# Patient Record
Sex: Female | Born: 1995 | Race: White | Hispanic: No | Marital: Married | Smoking: Never smoker
Health system: Southern US, Community
[De-identification: ages and names within clinical notes are randomized; demographics above are authoritative.]

## PROBLEM LIST (undated history)

## (undated) DIAGNOSIS — K219 Gastro-esophageal reflux disease without esophagitis: Secondary | ICD-10-CM

## (undated) DIAGNOSIS — I499 Cardiac arrhythmia, unspecified: Secondary | ICD-10-CM

## (undated) DIAGNOSIS — N3010 Interstitial cystitis (chronic) without hematuria: Secondary | ICD-10-CM

## (undated) DIAGNOSIS — I456 Pre-excitation syndrome: Secondary | ICD-10-CM

## (undated) HISTORY — DX: Gastro-esophageal reflux disease without esophagitis: K21.9

## (undated) HISTORY — DX: Cardiac arrhythmia, unspecified: I49.9

## (undated) HISTORY — DX: Interstitial cystitis (chronic) without hematuria: N30.10

---
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
## 2013-04-07 DIAGNOSIS — R1013 Epigastric pain: Secondary | ICD-10-CM | POA: Insufficient documentation

## 2013-09-04 ENCOUNTER — Ambulatory Visit: Payer: BC Managed Care – PPO | Admitting: Cardiovascular Disease

## 2013-09-08 ENCOUNTER — Ambulatory Visit (INDEPENDENT_AMBULATORY_CARE_PROVIDER_SITE_OTHER): Payer: BC Managed Care – PPO | Admitting: Cardiovascular Disease

## 2013-09-08 ENCOUNTER — Encounter: Payer: Self-pay | Admitting: Cardiovascular Disease

## 2013-09-08 VITALS — BP 120/90 | HR 92 | Ht 66.0 in | Wt 158.0 lb

## 2013-09-08 DIAGNOSIS — R002 Palpitations: Secondary | ICD-10-CM

## 2013-09-08 DIAGNOSIS — R000 Tachycardia, unspecified: Secondary | ICD-10-CM

## 2013-09-08 MED ORDER — METOPROLOL SUCCINATE ER 25 MG PO TB24: 25.0000 mg | ORAL_TABLET | Freq: Every day | ORAL | Status: DC

## 2013-09-08 NOTE — Assessment & Plan Note (Signed)
Symptoms are overall mild . Previous outpatient telemetry showed only sinus tachycardia. EKG today shows delta waves which were not present on prior EKGs. 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 Both of them has not manifested with tachyarrhythmia. Continue observation for now. She can continue to use Toprol as needed and should notify us with any episodes of tachycardia especially if associated with presyncope or syncope.

## 2013-09-08 NOTE — Patient Instructions (Signed)
Your physician wants you to follow-up in: 1 year. You will receive a reminder letter in the mail two months in advance. If you don't receive a letter, please call our office to schedule the follow-up appointment.  

## 2013-09-08 NOTE — Progress Notes (Signed)
HPI  Katherine Schultz is a pleasant 18 year old female who is here today for a followup visit regarding sinus tachycardia. She is with her mother today. 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 Her father who is my patient has WPW pattern on his EKG.  She is suspected of having inappropriate sinus tachycardia which has been treated with small dose Toprol. She has not taken this medication since August with no worsening symptoms. She complains of palpitations mostly when she is under stress. No other associated symptoms.  No Known Allergies   No current outpatient prescriptions on file prior to visit.   No current facility-administered medications on file prior to visit.     Past Medical History  Diagnosis Date  . Arrhythmia     Tachycardia     History reviewed. No pertinent past surgical history.   Family History  Problem Relation Age of Onset  . Family history unknown: Yes     History   Social History  . Marital Status: Single    Spouse Name: N/A    Number of Children: N/A  . Years of Education: N/A   Occupational History  . Not on file.   Social History Main Topics  . Smoking status: Never Smoker   . Smokeless tobacco: Not on file  . Alcohol Use: No  . Drug Use: No  . Sexual Activity: Not on file   Other Topics Concern  . Not on file   Social History Narrative  . No narrative on file     ROS Constitutional: Negative for fever, chills, diaphoresis, activity change, appetite change and fatigue.  HENT: Negative for hearing loss, nosebleeds, congestion, sore throat, facial swelling, drooling, trouble swallowing, neck pain, voice change, sinus pressure and tinnitus.  Eyes: Negative for photophobia, pain, discharge and visual disturbance.  Respiratory: Negative for apnea, cough  and wheezing.  Cardiovascular: Negative for leg swelling.  Gastrointestinal: Negative for nausea, vomiting, abdominal pain,  diarrhea, constipation, blood in stool and abdominal distention.  Genitourinary: Negative for dysuria, urgency, frequency, hematuria and decreased urine volume.  Musculoskeletal: Negative for myalgias, back pain, joint swelling, arthralgias and gait problem.  Skin: Negative for color change, pallor, rash and wound.  Neurological: Negative for dizziness, tremors, seizures, syncope, speech difficulty, weakness, light-headedness, numbness and headaches.  Psychiatric/Behavioral: Negative for suicidal ideas, hallucinations, behavioral problems and agitation. The patient is not nervous/anxious.     PHYSICAL EXAM   BP 120/90  Pulse 92  Ht 5\' 6"  (1.676 m)  Wt 158 lb (71.668 kg)  BMI 25.51 kg/m2 Constitutional: She is oriented to person, place, and time. She appears well-developed and well-nourished. No distress.  HENT: No nasal discharge.  Head: Normocephalic and atraumatic.  Eyes: Pupils are equal and round. Right eye exhibits no discharge. Left eye exhibits no discharge.  Neck: Normal range of motion. Neck supple. No JVD present. No thyromegaly present.  Cardiovascular: Normal rate, regular rhythm, normal heart sounds. Exam reveals no gallop and no friction rub. No murmur heard.  Pulmonary/Chest: Effort normal and breath sounds normal. No stridor. No respiratory distress. She has no wheezes. She has no rales. She exhibits no tenderness.  Abdominal: Soft. Bowel sounds are normal. She exhibits no distension. There is no tenderness. There is no rebound and no guarding.  Musculoskeletal: Normal range of motion. She exhibits no edema and no tenderness.  Neurological: She is alert and oriented to person, place, and time. Coordination normal.  Skin: Skin is  warm and dry. No rash noted. She is not diaphoretic. No erythema. No pallor.  Psychiatric: She has a normal mood and affect. Her behavior is normal. Judgment and thought content normal.     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

## 2013-09-09 ENCOUNTER — Telehealth: Payer: Self-pay | Admitting: *Deleted

## 2013-09-09 NOTE — Telephone Encounter (Signed)
Message copied by Fransico SettersBAUCOM, Shulamit Donofrio E on Tue Sep 09, 2013  8:46 AM ------      Message from: Lorine BearsARIDA, MUHAMMAD A      Created: Mon Sep 08, 2013  5:13 PM       I forgot to discuss the EKG with her during visit. I tried calling but there was no answer.       The EKG this time showed a delta wave which is similar to what her dad has. This does not change anything  now but I do want her to know that so that she can notify us if gets any unusual palpitations or tachycardia.  ------

## 2013-09-09 NOTE — Telephone Encounter (Signed)
LVM 5/19

## 2013-09-10 NOTE — Telephone Encounter (Signed)
LVM 5/20

## 2013-09-12 NOTE — Telephone Encounter (Signed)
Informed patient of Dr Sheilah Pigeon message  Patient verbalized understanding

## 2014-01-20 ENCOUNTER — Encounter: Payer: Self-pay | Admitting: Family Medicine

## 2014-01-20 ENCOUNTER — Ambulatory Visit (INDEPENDENT_AMBULATORY_CARE_PROVIDER_SITE_OTHER): Payer: BC Managed Care – PPO | Admitting: Family Medicine

## 2014-01-20 VITALS — BP 108/78 | HR 126 | Ht 65.0 in | Wt 161.0 lb

## 2014-01-20 DIAGNOSIS — R29898 Other symptoms and signs involving the musculoskeletal system: Secondary | ICD-10-CM

## 2014-01-20 DIAGNOSIS — M24559 Contracture, unspecified hip: Secondary | ICD-10-CM | POA: Insufficient documentation

## 2014-01-20 DIAGNOSIS — M24851 Other specific joint derangements of right hip, not elsewhere classified: Secondary | ICD-10-CM

## 2014-01-20 DIAGNOSIS — M24859 Other specific joint derangements of unspecified hip, not elsewhere classified: Secondary | ICD-10-CM | POA: Insufficient documentation

## 2014-01-20 DIAGNOSIS — M9981 Other biomechanical lesions of cervical region: Secondary | ICD-10-CM

## 2014-01-20 DIAGNOSIS — M999 Biomechanical lesion, unspecified: Secondary | ICD-10-CM | POA: Insufficient documentation

## 2014-01-20 DIAGNOSIS — M6240 Contracture of muscle, unspecified site: Secondary | ICD-10-CM

## 2014-01-20 NOTE — Patient Instructions (Signed)
Very nice to meet you Exercises 3 times a week. Alternating handouts Exercises on wall.  Heel and butt touching.  Raise leg 6 inches and hold 2 seconds.  Down slow for count of 4 seconds.  1 set of 30 reps daily on both sides.  Ice 20 minutes after running Consider turmeric 500mg  twice daily to help with baseline inflammation.  Come back again in 3 weeks.

## 2014-01-20 NOTE — Progress Notes (Signed)
Katherine ScaleZach Livy Schultz D.O. [facility] [address] [place], [place] [address] Phone: [phone] Subjective:    CC: Right hip and back pain.   FAO:[id number]:Subjective Katherine ExonKaitlyn Schultz is a 18 y.o. female coming in with complaint of  right hip and back pain. She states that she has had this pain intermittently for multiple years. Patient does not remember any true injury. Patient states it is more of a dull throbbing aching pain it does not stop her from any activity. Denies any radiation of pain or any numbness or weakness of the lower extremities. Patient states that the pain is approximately 4/10 in severity and does not take any medications for it. Patient is not doing any other home remedies. Patient when she was a teenager did see multiple different doctors for her hip with no significant diagnosis. He nighttime awakening, denies any fevers chills or any abnormal weight loss.     Past medical history, social, surgical and family history all reviewed in electronic medical record.   Review of Systems: No headache, visual changes, nausea, vomiting, diarrhea, constipation, dizziness, abdominal pain, skin rash, fevers, chills, night sweats, weight loss, swollen lymph nodes, body aches, joint swelling, muscle aches, chest pain, shortness of breath, mood changes.   Objective Blood pressure 108/78, pulse 126, height 5\' 5"  (1.651 m), weight 161 lb (73.029 kg), SpO2 98.00%.  General: No apparent distress alert and oriented x3 mood and affect normal, dressed appropriately.  HEENT: Pupils equal, extraocular movements intact  Respiratory: Patient's speak in full sentences and does not appear short of breath  Cardiovascular: No lower extremity edema, non tender, no erythema  Skin: Warm dry intact with no signs of infection or rash on extremities or on axial skeleton.  Abdomen: Soft nontender  Neuro: Cranial nerves II through XII are intact, neurovascularly intact in all extremities with 2+ DTRs and 2+  pulses.  Lymph: No lymphadenopathy of posterior or anterior cervical chain or axillae bilaterally.  Gait normal with good balance and coordination.  MSK:  Non tender with full range of motion and good stability and symmetric strength and tone of shoulders, elbows, wrist,  knee and ankles bilaterally.  Hip: Right ROM IR: 25 Deg, ER: 45 Deg, Flexion: 120 Deg, Extension: 100 Deg, Abduction: 45 Deg, Adduction: 45 Deg Strength IR: 5/5, ER: 5/5, Flexion: 5/5, Extension: 5/5, Abduction: 4/5, Adduction: 5/5 Pelvic alignment unremarkable to inspection and palpation. Standing hip rotation and gait without trendelenburg sign / unsteadiness. Greater trochanter without tenderness to palpation. No tenderness over piriformis and greater trochanter. No pain with FABER or FADIR. No SI joint tenderness and normal minimal SI movement. Significant tightness of the hip flexors bilaterally. Contralateral hip unremarkable.  Back Exam:  Inspection: Unremarkable  Motion: Flexion 35 deg, Extension 45 deg, Side Bending to 35 deg bilaterally,  Rotation to 45 deg bilaterally  SLR laying: Negative  XSLR laying: Negative  Palpable tenderness: Mild tenderness over the paraspinal musculature on the right side of the lumbar spine FABER: negative. Sensory change: Gross sensation intact to all lumbar and sacral dermatomes.  Reflexes: 2+ at both patellar tendons, 2+ at achilles tendons, Babinski's downgoing.  Strength at foot  Plantar-flexion: 5/5 Dorsi-flexion: 5/5 Eversion: 5/5 Inversion: 5/5  Leg strength  Quad: 5/5 Hamstring: 5/5 Hip flexor: 5/5  Gait unremarkable.  OMT Physical Exam   Standing flexion right  Seated Flexion right  Cervical  C2 flexed rotated and side bent left C4 flexed rotated and side bent right  Thoracic T5 extended rotated and  side bent left   Lumbar L2 flexed rotated and side bent right  Sacrum Right on right     Impression and Recommendations:     This case required  medical decision making of moderate complexity.

## 2014-01-20 NOTE — Assessment & Plan Note (Signed)
Melena patient does have hip flexor tightness bilaterally that is giving her the snapping hip syndrome that she is also describing. Patient given home exercise program, hip abductor strengthening I think would beneficial, as well as core strengthening exercises. Discuss the patient to try to do the stretches after activity. We discussed over-the-counter medicines it could also be beneficial. Patient is going to try to do these different changes and come back again in 3-4 weeks for further followup.

## 2014-01-20 NOTE — Assessment & Plan Note (Signed)
Discussed previously. Patient given home exercises, hip abductor strengthening exercises as well as hip adductor strengthening exercises. Patient does have a try these as well as other modalities and come back in 3 weeks for further evaluation. Differential also includes possible hip dysplasia asthmas as a kid. We would need to consider further imaging if we are concerned with this. Patient though states that the pain has not stopped him from any activity at this time. Patient come back and see me again

## 2014-01-20 NOTE — Assessment & Plan Note (Signed)
Decision today to treat with OMT was based on Physical Exam  After verbal consent patient was treated with HVLA techniques in , cervical, thoracic, lumbar and sacral areas  Patient tolerated the procedure well with improvement in symptoms  Patient given exercises, stretches and lifestyle modifications  See medications in patient instructions if given  Patient will follow up in 3 weeks

## 2014-03-18 DIAGNOSIS — I456 Pre-excitation syndrome: Secondary | ICD-10-CM | POA: Insufficient documentation

## 2014-07-03 DIAGNOSIS — N23 Unspecified renal colic: Secondary | ICD-10-CM | POA: Insufficient documentation

## 2014-11-26 DIAGNOSIS — R300 Dysuria: Secondary | ICD-10-CM | POA: Insufficient documentation

## 2014-12-07 ENCOUNTER — Telehealth: Payer: Self-pay

## 2014-12-07 NOTE — Telephone Encounter (Signed)
Request from [facility] , sent to [facility] on 12/07/2014 .

## 2015-04-25 HISTORY — PX: CYSTOSCOPY: SUR368

## 2015-05-24 DIAGNOSIS — Z01818 Encounter for other preprocedural examination: Secondary | ICD-10-CM | POA: Insufficient documentation

## 2015-05-24 DIAGNOSIS — N946 Dysmenorrhea, unspecified: Secondary | ICD-10-CM | POA: Insufficient documentation

## 2015-05-24 DIAGNOSIS — N94810 Vulvar vestibulitis: Secondary | ICD-10-CM | POA: Insufficient documentation

## 2015-08-04 DIAGNOSIS — N3010 Interstitial cystitis (chronic) without hematuria: Secondary | ICD-10-CM | POA: Insufficient documentation

## 2015-08-04 DIAGNOSIS — M6289 Other specified disorders of muscle: Secondary | ICD-10-CM | POA: Insufficient documentation

## 2015-08-26 ENCOUNTER — Ambulatory Visit: Payer: BLUE CROSS/BLUE SHIELD | Attending: Obstetrics and Gynecology | Admitting: Physical Therapy

## 2015-08-26 ENCOUNTER — Encounter: Payer: Self-pay | Admitting: Physical Therapy

## 2015-08-26 DIAGNOSIS — R293 Abnormal posture: Secondary | ICD-10-CM | POA: Diagnosis present

## 2015-08-26 DIAGNOSIS — R279 Unspecified lack of coordination: Secondary | ICD-10-CM | POA: Diagnosis present

## 2015-08-26 DIAGNOSIS — M791 Myalgia, unspecified site: Secondary | ICD-10-CM

## 2015-08-26 NOTE — Patient Instructions (Addendum)
   You are now ready to begin training the deep core muscles system: diaphragm, transverse abdominis, pelvic floor . These muscles must work together as a team.           The key to these exercises to train the brain to coordinate the timing of these muscles and to have them turn on for long periods of time to hold you upright against gravity (especially important if you are on your feet all day).These muscles are postural muscles and play a role stabilizing your spine and bodyweight. By doing these repetitions slowly and correctly instead of doing crunches, you will achieve a flatter belly without a lower pooch. You are also placing your spine in a more neutral position and breathing properly which in turn, decreases your risk for problems related to your pelvic floor, abdominal, and low back such as pelvic organ prolapse, hernias, diastasis recti (separation of superficial muscles), disk herniations, spinal fractures. These exercises set a solid foundation for you to later progress to resistance/ strength training with therabands and weights and return to other typical fitness exercises with a stronger deeper core.   Do only level 1 (30 reps x twice a day) . Make sure to position spine in neutral (less low back  arch) fingers on rib and pelvis ASIS to check     Proper sitting: feet under knees, stacking ribcage over pelvis, weightbearing on sitting bones and less low back  Arch  Body scan : mindfulness technique for pain management : 3 cycles, dialing gradually to relaxation, to return to activities

## 2015-08-27 NOTE — Therapy (Addendum)
[facility] MAIN [facility] SERVICES [address] [place], [place], [address] Phone: [phone]   Fax:  [phone]  Physical Therapy Evaluation  Patient Details  Name: Katherine Schultz MRN: [id number] Date of Birth: 11-07-1995 Referring Provider: Everlene Other MD  Encounter Date: 08/26/2015      PT End of Session - 08/27/15 1823    Visit Number 1   Number of Visits 12   Date for PT Re-Evaluation 11/18/15   PT Start Time 1330   PT Stop Time 1440   PT Time Calculation (min) 70 min   Activity Tolerance Patient tolerated treatment well;No increased pain   Behavior During Therapy [facility] for tasks assessed/performed      Past Medical History  Diagnosis Date  . Arrhythmia     Tachycardia    History reviewed. No pertinent past surgical history.  There were no vitals filed for this visit.       Subjective Assessment - 08/27/15 1914    Subjective 1) Burning sensation in vaginal area: Pt reports burning sensation in the vaginal area since 02/2014. This sensation worsened as the bladder fills, menstrual cycle, and certain foods (alcohol, acidic foods) and now it has progressively worsened to be like a "burning, stabbing pain" daily 8-10/10 since her diagnostic tests/surgery 06/25/2015 (hydrodistention and scope) . Pt was Dx with IC and placed on an IUD to decrease menstrual-related pain.Difficulty tolerating gynecological exams due to tightness in her pelvic floor.  Triggers of pain:  Intense physical activities (running, walking, engaging abd muscles, and bending) and late day. Pain include radiation of pain on front of thighs. legs, and tops of her feet which has caused her to fall and it has been difficult to get out of bed.  Pt also reports she has LBP withmenstrual cycle and has worsened since her surgery 2) bowel movement: every other to 1x week. Bristol Stool Type 1-4. Pt takes Miralax. Hx of constipation sicne a child and straining. Hx of anal fissures and  hemorrhoid. 3) difficulty with fully emptying urine 50%-75% of the time. Frequency has also increased over the past year and a half.    Pertinent History Hx of painful menses. [place]. Pt will be traveling to [place] on May 16   Patient Stated Goals finding ways to function and return to exercising and manage pain            [facility] PT Assessment - 08/27/15 1902    Assessment   Medical Diagnosis pelvic pain    Referring Provider Everlene Other MD   Precautions   Precautions None   Restrictions   Weight Bearing Restrictions No   Balance Screen   Has the patient fallen in the past 6 months No   Observation/Other Assessments   Observations excessive lumbar lordosis in sitting/ hooklying   Other Surveys  --  NIH-CPSI Female 81%, ZUNG anxiety scale 46%    Coordination   Gross Motor Movements are Fluid and Coordinated --  limited pelvic floor lengthening, chest breathing   Fine Motor Movements are Fluid and Coordinated --  abdominal straining w/ cue for bowel movement   Running   Comments to assess at next session   Other:   Other/ Comments able to leaern pelvic neutral/ tilt without difficulty (pt reported relief in pelvic area/suprapubic area with neuro-reedu)    Posture/Postural Control   Posture Comments lumbopelvic instability with hip flexion                 Pelvic  Floor Special Questions - 08/27/15 1907    External Palpation tenderness and tensions at anterior pelvic floor mm , suprapubic area (decreased post Tx)                  PT Education - 08/27/15 1911    Education provided Yes   Education Details POC, anatomy/physiology, goals, postural alignment, coordination breathing to relax pelvic floor mm, body scan   Person(s) Educated Patient   Methods Explanation;Demonstration;Tactile cues;Verbal cues;Handout   Comprehension Returned demonstration;Verbalized understanding             PT Long Term Goals - 08/31/15 0930    PT LONG TERM GOAL #1    Title Pt will decrease her score on NIH-CPSI Female 81% to < 50% in order to restore pelvic floor function.     Time 2   Period Weeks   Status New   PT LONG TERM GOAL #2   Title Pt will decrease her score on ZUNG anxiety scale 46% to < 30% in order to demo improved self-management of stress/ anxiety to decrease mm spasms.    Time 2   Period Weeks   Status New   PT LONG TERM GOAL #3   Title Pt will demo proper walking, running, standing mechanics in order to return to ADLs.   Time 2   Period Weeks   Status New                  Plan - 08/27/15 1914    Clinical Impression Statement Pt is a 20 yo female who c/o burning sensation in her perineal area, constipation, and has difficulty with completing urination. Pt 's clinical presentations include paradoxical coordination of pelvic floor mm and deep core mm, poor posture, pelvic floor mm tensions/tenderness, and lack of stress-management skills.  Patient will benefit from skilled therapeutic intervention in order to improve the following deficits and impairments:  Pain, Improper body mechanics, Impaired sensation, Increased muscle spasms, Postural dysfunction, Decreased mobility, Decreased coordination, Decreased activity tolerance, Decreased strength, Decreased range of motion, Decreased endurance, Hypomobility, Decreased safety awareness    Rehab Potential Good   PT Frequency 2x / week   PT Duration 2 weeks   PT Treatment/Interventions ADLs/Self Care Home Management;Aquatic Therapy;Cryotherapy;Electrical Stimulation;Moist Heat;Traction;Functional mobility training;Therapeutic activities;Therapeutic exercise;Balance training;Neuromuscular re-education;Manual techniques;Patient/family education   Consulted and Agree with Plan of Care Patient      Patient will benefit from skilled therapeutic intervention in order to improve the following deficits and impairments:  Pain, Improper body mechanics, Impaired sensation, Increased muscle  spasms, Postural dysfunction, Decreased mobility, Decreased coordination, Decreased activity tolerance, Decreased strength, Decreased range of motion, Decreased endurance, Hypomobility, Decreased safety awareness  Visit Diagnosis: Unspecified lack of coordination  Myalgia  Abnormal posture     Problem List Patient Active Problem List   Diagnosis Date Noted  . Hip flexor tightness 01/20/2014  . 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 Nonallopathic lesion of lumbosacral region 01/20/2014  . Nonallopathic lesion of thoracic region 01/20/2014  . Nonallopathic lesion of cervical region 01/20/2014  . Tachycardia, unspecified 10/22/2012    Mariane MastersYeung,Shin Yiing ,PT, DPT, E-RYT  08/27/2015, 7:17 PM  [facility] [address] [place], [place], [address] Phone: [phone]   Fax:  [phone]  Name: Gertie ExonKaitlyn Willis MRN: [id number] Date of Birth: Apr 16, 1996

## 2015-08-31 ENCOUNTER — Ambulatory Visit: Payer: BLUE CROSS/BLUE SHIELD | Admitting: Physical Therapy

## 2015-08-31 DIAGNOSIS — M791 Myalgia, unspecified site: Secondary | ICD-10-CM

## 2015-08-31 DIAGNOSIS — R293 Abnormal posture: Secondary | ICD-10-CM

## 2015-08-31 DIAGNOSIS — R279 Unspecified lack of coordination: Secondary | ICD-10-CM | POA: Diagnosis not present

## 2015-08-31 NOTE — Addendum Note (Signed)
Addended by: Mariane MastersYEUNG, SHIN-YIING on: 08/31/2015 09:35 AM   Modules accepted: Orders

## 2015-08-31 NOTE — Patient Instructions (Addendum)
Deep core level 2    SELF_CARE ROUTINE:  Body scan practice  Restorative pose option (10 min-15 min):  1)  Legs up the wall 2)  yoga blocks placed under midback vertically, and one under head (block is 2" in height)            3) blocks under  Sacrum     Mid-back stretch: Open book    Walking: breathing, center of mass over forefoot, arm swings Standing, semi tandem stance, or feet parallel.  Noticed where weight is through longitudinal arch of sole

## 2015-08-31 NOTE — Therapy (Signed)
[place] [facility] MAIN [facility] SERVICES [address] [place], [place], [address] Phone: [phone]   Fax:  [phone]  Physical Therapy Treatment  Patient Details  Name: Katherine Schultz MRN: [id number] Date of Birth: [birth year] Referring Provider: Everlene Other MD  Encounter Date: 08/31/2015      PT End of Session - 08/31/15 0924    Visit Number 2   Number of Visits 12   Date for PT Re-Evaluation 11/18/15   PT Start Time 0805   PT Stop Time 0900   PT Time Calculation (min) 55 min   Activity Tolerance Patient tolerated treatment well;No increased pain   Behavior During Therapy [facility] for tasks assessed/performed      Past Medical History  Diagnosis Date  . Arrhythmia     Tachycardia    No past surgical history on file.  There were no vitals filed for this visit.      Subjective Assessment - 08/31/15 0921    Subjective Pt reported 50% improvement with pain as she notices when she is tensing up and will apply breahting technique to minimize mm spasms.  Pt finds that having her feet on a stool helps her with bowel movements and now has BM  2-3 x day which she states that "it is unheard of her her,"     Patient is accompained by: Family member  mother   Pertinent History Hx of painful menses. [place]. Pt will be traveling to [place] on May 16   Patient Stated Goals finding ways to function and return to exercising and manage pain            [facility] PT Assessment - 08/31/15 0919    Coordination   Gross Motor Movements are Fluid and Coordinated --  midback tensions limited lower abdominal expansion    Ambulation/Gait   Gait Comments limited arm swing, posterior COM, heel striking                  Pelvic Floor Special Questions - 08/31/15 0917    External Palpation no mm tension/ tenderness           [facility] Adult PT Treatment/Exercise - 08/31/15 0919    Therapeutic Activites    Therapeutic Activities --  restorative poses,  body scan, importance of self-care/relaxa   Neuro Re-ed    Neuro Re-ed Details  see pt instructions with HEP and gait training   Manual Therapy   Manual therapy comments midback STM releases , also at [employer]                PT Education - 08/31/15 0920    Education provided Yes   Education Details HEP   Person(s) Educated Patient   Methods Explanation;Demonstration;Tactile cues;Verbal cues;Handout   Comprehension Returned demonstration;Verbalized understanding             PT Long Term Goals - 08/31/15 0930    PT LONG TERM GOAL #1   Title Pt will decrease her score on NIH-CPSI Female 81% to < 50% in order to restore pelvic floor function.     Time 2   Period Weeks   Status New   PT LONG TERM GOAL #2   Title Pt will decrease her score on ZUNG anxiety scale 46% to < 30% in order to demo improved self-management of stress/ anxiety to decrease mm spasms.    Time 2   Period Weeks   Status New   PT LONG TERM GOAL #3   Title  Pt will demo proper walking, running, standing mechanics in order to return to ADLs.   Time 2   Period Weeks   Status New               Plan - 08/31/15 [id number]    Clinical Impression Statement Pt is progressing well towards her goals and demonstrated coordination of deep core mm. Pt showed  no pelvic floor mm tensions/tenderness today but required maniual Tx for releasing her midback mm to improve diaphragmatic excursion and armswing during gait. Pt showed improved gait mechanics and reported not feeling any pelvic/back pain.   Plan to assess running gait..    Rehab Potential Good   PT Frequency 2x / week   PT Duration 2 weeks   PT Treatment/Interventions ADLs/Self Care Home Management;Aquatic Therapy;Cryotherapy;Electrical Stimulation;Moist Heat;Traction;Functional mobility training;Therapeutic activities;Therapeutic exercise;Balance training;Neuromuscular re-education;Manual techniques;Patient/family education   Consulted and Agree with  Plan of Care Patient      Patient will benefit from skilled therapeutic intervention in order to improve the following deficits and impairments:  Pain, Improper body mechanics, Impaired sensation, Increased muscle spasms, Postural dysfunction, Decreased mobility, Decreased coordination, Decreased activity tolerance, Decreased strength, Decreased range of motion, Decreased endurance, Hypomobility, Decreased safety awareness  Visit Diagnosis: Unspecified lack of coordination  Myalgia  Abnormal posture     Problem List Patient Active Problem List   Diagnosis Date Noted  . Hip flexor tightness 01/20/2014  . 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 Nonallopathic lesion of lumbosacral region 01/20/2014  . Nonallopathic lesion of thoracic region 01/20/2014  . Nonallopathic lesion of cervical region 01/20/2014  . Tachycardia, unspecified 10/22/2012    Mariane MastersYeung,Shin Yiing ,PT, DPT, E-RYT  08/31/2015, 9:38 AM  [place] [facility] [address] [place], [place], [address] Phone: [phone]   Fax:  [phone]  Name: Katherine Schultz MRN: [id number] Date of Birth: [birth year]

## 2015-09-01 ENCOUNTER — Ambulatory Visit: Payer: BLUE CROSS/BLUE SHIELD | Admitting: Physical Therapy

## 2015-09-01 DIAGNOSIS — R279 Unspecified lack of coordination: Secondary | ICD-10-CM | POA: Diagnosis not present

## 2015-09-01 DIAGNOSIS — R293 Abnormal posture: Secondary | ICD-10-CM

## 2015-09-01 DIAGNOSIS — M791 Myalgia, unspecified site: Secondary | ICD-10-CM

## 2015-09-02 NOTE — Therapy (Signed)
[place] MAIN [facility] SERVICES [address] [place], [place], [address] Phone: [phone]   Fax:  [phone]  Physical Therapy Treatment [facility] Note  Patient Details  Name: Katherine Schultz MRN: [id number] Date of Birth: [birth year] Referring Provider: Illene Bolus MD  Encounter Date: 09/01/2015      PT End of Session - 09/02/15 2027    Visit Number 3   Number of Visits 12   Date for PT Re-Evaluation 11/18/15   PT Start Time 0998   PT Stop Time 1140   PT Time Calculation (min) 35 min   Activity Tolerance Patient tolerated treatment well;No increased pain   Behavior During Therapy [facility] for tasks assessed/performed      Past Medical History  Diagnosis Date  . Arrhythmia     Tachycardia    No past surgical history on file.  There were no vitals filed for this visit.      Subjective Assessment - 09/02/15 2023    Subjective Pt reports she has continued to notice improvement with less pain in walking since yesterday's session. Pt is feeling excited to return to run.    Patient is accompained by: Family member  mother   Pertinent History Hx of painful menses. [place]. Pt will be traveling to [place] on May 16   Patient Stated Goals finding ways to function and return to exercising and manage pain            [facility] PT Assessment - 09/02/15 2024    Running   Comments pronation, genu valgus B. (post-Tx: deviations decreased)    Other:   Other/ Comments pt also noted increased tensions in suprapubic area (post Tx: pt reported no more tensions)                      [facility] Adult PT Treatment/Exercise - 09/02/15 2025    Therapeutic Activites    Therapeutic Activities --  running gait training, supportive strengthening for hip abd,   Other Therapeutic Activities running stretches, pylometrics with deep core engaged  down hill, up hill mechanics                PT Education - 09/02/15 2026    Education provided  Yes   Education Details running mechanics with deep core engaged and supportive exercises    Person(s) Educated Patient   Methods Explanation;Demonstration;Tactile cues;Verbal cues   Comprehension Returned demonstration;Verbalized understanding             PT Long Term Goals - 09/02/15 2022    PT LONG TERM GOAL #1   Title Pt will decrease her score on NIH-CPSI Female 81% to < 50% in order to restore pelvic floor function.  (5/10: 37%)   Time 2   Period Weeks   Status Achieved   PT LONG TERM GOAL #2   Title Pt will decrease her score on ZUNG anxiety scale 46% to < 30% in order to demo improved self-management of stress/ anxiety to decrease mm spasms.  (5/10: 29%)   Time 2   Period Weeks   Status Achieved   PT LONG TERM GOAL #3   Title Pt will demo proper walking, running, standing mechanics in order to return to ADLs.   Time 2   Period Weeks   Status Achieved               Plan - 09/02/15 2027    Clinical Impression Statement Pt reported her pelvic pain related  to IC has improved "A very great deal better" based on the [facility]. Pt has met 100% of her goals across 3 visits. Pt's NIH-CPSI score decreased significantly from 81% to 46% and ZUNG anxiety scale from 46% to 29% which indicate she has learned to manage her stress levels which in turn enabled her to decrease her mm spasms w/ IND.  Pt demo IND with management her IC-related Sx and demo'd improved walking and running mechanics following session. Pt is ready to be d/c at this time. PT will provide pt a pelvic health PT referral in [place] via email in case pt may need to receive continued care upon her arrival there for the next 7 months. Pt and mom expressed joy and relief for the improvements pt achieved. Thank you for your referral.     Rehab Potential Good   PT Frequency 2x / week   PT Duration 2 weeks   PT Treatment/Interventions ADLs/Self Care Home Management;Aquatic Therapy;Cryotherapy;Electrical  Stimulation;Moist Heat;Traction;Functional mobility training;Therapeutic activities;Therapeutic exercise;Balance training;Neuromuscular re-education;Manual techniques;Patient/family education   Consulted and Agree with Plan of Care Patient      Patient will benefit from skilled therapeutic intervention in order to improve the following deficits and impairments:  Pain, Improper body mechanics, Impaired sensation, Increased muscle spasms, Postural dysfunction, Decreased mobility, Decreased coordination, Decreased activity tolerance, Decreased strength, Decreased range of motion, Decreased endurance, Hypomobility, Decreased safety awareness  Visit Diagnosis: Unspecified lack of coordination  Myalgia  Abnormal posture     Problem List Patient Active Problem List   Diagnosis Date Noted  . Hip flexor tightness 01/20/2014  . 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 Nonallopathic lesion of lumbosacral region 01/20/2014  . Nonallopathic lesion of thoracic region 01/20/2014  . Nonallopathic lesion of cervical region 01/20/2014  . Tachycardia, unspecified 10/22/2012    Katherine Schultz ,PT, DPT, E-RYT  09/02/2015, 8:32 PM  [place] MAIN [facility] SERVICES [address] [place], [place], [address] Phone: [phone]   Fax:  [phone]  Name: Katherine Schultz MRN: [id number] Date of Birth: [birth year]

## 2015-09-06 ENCOUNTER — Encounter: Payer: Self-pay | Admitting: Physical Therapy

## 2015-09-14 ENCOUNTER — Encounter: Payer: Self-pay | Admitting: Obstetrics and Gynecology

## 2016-10-23 DIAGNOSIS — H40033 Anatomical narrow angle, bilateral: Secondary | ICD-10-CM | POA: Diagnosis not present

## 2016-11-11 ENCOUNTER — Emergency Department
Admission: EM | Admit: 2016-11-11 | Discharge: 2016-11-12 | Disposition: A | Discharge status: Home / Self Care | Payer: BLUE CROSS/BLUE SHIELD | Attending: Emergency Medicine | Admitting: Emergency Medicine

## 2016-11-11 ENCOUNTER — Encounter: Payer: Self-pay | Admitting: Emergency Medicine

## 2016-11-11 DIAGNOSIS — Z79899 Other long term (current) drug therapy: Secondary | ICD-10-CM | POA: Insufficient documentation

## 2016-11-11 DIAGNOSIS — R4020 Unspecified coma: Secondary | ICD-10-CM | POA: Diagnosis not present

## 2016-11-11 DIAGNOSIS — F10920 Alcohol use, unspecified with intoxication, uncomplicated: Secondary | ICD-10-CM | POA: Insufficient documentation

## 2016-11-11 DIAGNOSIS — F10129 Alcohol abuse with intoxication, unspecified: Secondary | ICD-10-CM | POA: Diagnosis not present

## 2016-11-11 DIAGNOSIS — R1111 Vomiting without nausea: Secondary | ICD-10-CM | POA: Diagnosis not present

## 2016-11-11 LAB — CBC
HCT: 41.2 % (ref 35.0–47.0)
Hemoglobin: 14 g/dL (ref 12.0–16.0)
MCH: 30.8 pg (ref 26.0–34.0)
MCHC: 33.9 g/dL (ref 32.0–36.0)
MCV: 90.9 fL (ref 80.0–100.0)
Platelets: 224 10*3/uL (ref 150–440)
RBC: 4.53 MIL/uL (ref 3.80–5.20)
RDW: 13.4 % (ref 11.5–14.5)
WBC: 8.3 10*3/uL (ref 3.6–11.0)

## 2016-11-11 MED ORDER — SODIUM CHLORIDE 0.9 % IV BOLUS (SEPSIS)
2000.0000 mL | Freq: Once | INTRAVENOUS | Status: AC
  Administered 2016-11-11: 2000 mL via INTRAVENOUS

## 2016-11-11 MED ORDER — ONDANSETRON HCL 4 MG/2ML IJ SOLN
INTRAMUSCULAR | Status: AC
  Administered 2016-11-11: 4 mg via INTRAVENOUS
  Filled 2016-11-11: qty 2

## 2016-11-11 MED ORDER — ONDANSETRON HCL 4 MG/2ML IJ SOLN
4.0000 mg | Freq: Once | INTRAMUSCULAR | Status: AC
  Administered 2016-11-11: 4 mg via INTRAVENOUS

## 2016-11-11 NOTE — ED Provider Notes (Signed)
[facility] Emergency Department Provider Note    First MD Initiated Contact with Patient 11/11/16 2336     (approximate)  I have reviewed the triage vital signs and the nursing notes.  Level V caveat: Altered mental status, positive intoxication HISTORY  Chief Complaint Alcohol Intoxication    HPI Katherine Schultz is a 21 y.o. female presents with altered mental status which apparently the patient states is secondary to EtOH ingestion tonight. Per the patient's parents young lady went to a hospital party tonight and reportedly consumed "liquor". Patient states that the patient is not a routine consumer of alcohol.  Past Medical History:  Diagnosis Date  . Arrhythmia    Tachycardia    Patient Active Problem List   Diagnosis Date Noted  . Hip flexor tightness 01/20/2014  . 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 Nonallopathic lesion of lumbosacral region 01/20/2014  . Nonallopathic lesion of thoracic region 01/20/2014  . Nonallopathic lesion of cervical region 01/20/2014  . Tachycardia, unspecified 10/22/2012    History reviewed. No pertinent surgical history.  Prior to Admission medications   Medication Sig Start Date End Date Taking? Authorizing Provider  imipramine (TOFRANIL) 10 MG tablet Take 10 mg by mouth at bedtime.    [provider]  metoprolol succinate (TOPROL-XL) 25 MG 24 hr tablet Take 1 tablet (25 mg total) by mouth daily. 09/08/13   Iran Ouch, MD  pentosan polysulfate (ELMIRON) 100 MG capsule Take 100 mg by mouth 3 (three) times daily.    [provider]    Allergies No known drug allergies  History reviewed. No pertinent family history.  Social History Social History  Substance Use Topics  . Smoking status: Never Smoker  . Smokeless tobacco: Never Used  . Alcohol use No    Review of Systems Constitutional: No fever/chills Eyes: No visual changes. ENT: No sore throat. Cardiovascular: Denies chest  pain. Respiratory: Denies shortness of breath. Gastrointestinal: No abdominal pain.  No nausea, no vomiting.  No diarrhea.  No constipation. Genitourinary: Negative for dysuria. Musculoskeletal: Negative for neck pain.  Negative for back pain. Integumentary: Negative for rash. Neurological: Negative for headaches, focal weakness or numbness.  ____________________________________________   PHYSICAL EXAM:  VITAL SIGNS: ED Triage Vitals [11/11/16 2340]  Enc Vitals Group     BP 117/90     Pulse Rate (!) 109     Resp 20     Temp 98.1 F (36.7 C)     Temp Source Oral     SpO2 100 %     Weight 73 kg (161 lb)     Height 1.651 m (5\' 5" )     Head Circumference      Peak Flow      Pain Score Asleep     Pain Loc      Pain Edu?      Excl. in GC?     Constitutional: Somnolent but arousable by verbal stimuli. Appears intoxicated Eyes: Conjunctivae are normal. PERRL. EOMI. Head: Atraumatic. Mouth/Throat: Mucous membranes are moist.  Oropharynx non-erythematous. Neck: No stridor.   Cardiovascular: Normal rate, regular rhythm. Good peripheral circulation. Grossly normal heart sounds. Respiratory: Normal respiratory effort.  No retractions. Lungs CTAB. Gastrointestinal: Soft and nontender. No distention.  Musculoskeletal: No lower extremity tenderness nor edema. No gross deformities of extremities. Neurologic:  Normal speech and language. No gross focal neurologic deficits are appreciated.  Skin:  Skin is warm, dry and intact. No rash noted. Psychiatric: Mood and affect are normal.  Speech and behavior are normal.  ____________________________________________   LABS (all labs ordered are listed, but only abnormal results are displayed)  Labs Reviewed  ETHANOL - Abnormal; Notable for the following:       Result Value   Alcohol, Ethyl (B) 120 (*)    All other components within normal limits  COMPREHENSIVE METABOLIC PANEL  CBC     Procedures   ____________________________________________   INITIAL IMPRESSION / ASSESSMENT AND PLAN / ED COURSE  Pertinent labs & imaging results that were available during my care of the patient were reviewed by me and considered in my medical decision making (see chart for details).  Patient now alert and oriented 4 denies any trauma. Friend at bedside stated that she drove the patient home and also denies any trauma stating that the patient never fell.      ____________________________________________  FINAL CLINICAL IMPRESSION(S) / ED DIAGNOSES  Final diagnoses:  Alcoholic intoxication without complication (HCC)     MEDICATIONS GIVEN DURING THIS VISIT:  Medications  ondansetron (ZOFRAN) injection 4 mg (4 mg Intravenous Given 11/11/16 2343)  sodium chloride 0.9 % bolus 2,000 mL (2,000 mLs Intravenous New Bag/Given 11/11/16 2351)     NEW OUTPATIENT MEDICATIONS STARTED DURING THIS VISIT:  New Prescriptions   No medications on file    Modified Medications   No medications on file    Discontinued Medications   No medications on file     Note:  This document was prepared using Dragon voice recognition software and may include unintentional dictation errors.    Darci CurrentBrown,  N, MD 11/12/16 [phone]

## 2016-11-11 NOTE — ED Triage Notes (Signed)
Pt to rm 9 via EMS from home, report ETOH intoxication, vomit x 3, pt responsive to stimulation only, non-verbal, parents at bedside

## 2016-11-12 LAB — COMPREHENSIVE METABOLIC PANEL
ALK PHOS: 44 U/L (ref 38–126)
ALT: 15 U/L (ref 14–54)
AST: 19 U/L (ref 15–41)
Albumin: 4.9 g/dL (ref 3.5–5.0)
Anion gap: 11 (ref 5–15)
BUN: 15 mg/dL (ref 6–20)
CALCIUM: 9.1 mg/dL (ref 8.9–10.3)
CO2: 24 mmol/L (ref 22–32)
Chloride: 109 mmol/L (ref 101–111)
Creatinine, Ser: 0.95 mg/dL (ref 0.44–1.00)
GFR calc Af Amer: >60 mL/min (ref >60)
GFR calc non Af Amer: >60 mL/min (ref >60)
Glucose, Bld: 96 mg/dL (ref 65–99)
Potassium: 3.7 mmol/L (ref 3.5–5.1)
SODIUM: 144 mmol/L (ref 135–145)
TOTAL PROTEIN: 7.6 g/dL (ref 6.5–8.1)
Total Bilirubin: 1 mg/dL (ref 0.3–1.2)

## 2016-11-12 LAB — ETHANOL: Alcohol, Ethyl (B): 120 mg/dL — ABNORMAL HIGH (ref <5)

## 2016-11-12 NOTE — ED Notes (Signed)
Pt was assisted to restroom and changed (by choice) into our blue scrubs due to her wet clothing

## 2016-12-01 ENCOUNTER — Other Ambulatory Visit: Payer: BLUE CROSS/BLUE SHIELD

## 2016-12-22 ENCOUNTER — Other Ambulatory Visit: Payer: Self-pay | Admitting: *Deleted

## 2016-12-22 ENCOUNTER — Other Ambulatory Visit: Payer: Self-pay

## 2016-12-22 DIAGNOSIS — R300 Dysuria: Secondary | ICD-10-CM

## 2016-12-22 DIAGNOSIS — R399 Unspecified symptoms and signs involving the genitourinary system: Secondary | ICD-10-CM

## 2016-12-22 LAB — POCT URINALYSIS DIPSTICK
BILIRUBIN UA: NEGATIVE
GLUCOSE UA: NEGATIVE
KETONES UA: NEGATIVE
Nitrite, UA: NEGATIVE
Protein, UA: NEGATIVE
SPEC GRAV UA: 1.015 (ref 1.010–1.025)
Urobilinogen, UA: 0.2 E.U./dL
pH, UA: 6 (ref 5.0–8.0)

## 2016-12-22 MED ORDER — PHENAZOPYRIDINE HCL 97.2 MG PO TABS: 97.0000 mg | ORAL_TABLET | Freq: Three times a day (TID) | ORAL | 0 refills | Status: DC | PRN

## 2016-12-24 LAB — URINE CULTURE

## 2016-12-26 ENCOUNTER — Other Ambulatory Visit: Payer: Self-pay | Admitting: Obstetrics and Gynecology

## 2016-12-26 ENCOUNTER — Telehealth: Payer: Self-pay | Admitting: *Deleted

## 2016-12-26 MED ORDER — AMOXICILLIN 500 MG PO CAPS: 500.0000 mg | ORAL_CAPSULE | Freq: Three times a day (TID) | ORAL | 2 refills | Status: DC

## 2016-12-26 NOTE — Telephone Encounter (Signed)
-----   Message from Purcell NailsMelody N [place]y, [place] sent at 12/26/2016  8:19 AM EDT ----- Please let her know know results, I sent in rx for Amoxil as it is best for this type of UTI. To increase yogurt/probiotics while taking this to prevent yeast infection.

## 2016-12-26 NOTE — Telephone Encounter (Signed)
Spoke with pt

## 2017-01-18 DIAGNOSIS — Z23 Encounter for immunization: Secondary | ICD-10-CM | POA: Diagnosis not present

## 2017-03-27 ENCOUNTER — Other Ambulatory Visit: Payer: Self-pay

## 2017-03-27 ENCOUNTER — Encounter: Payer: Self-pay | Admitting: *Deleted

## 2017-03-27 ENCOUNTER — Emergency Department
Admission: EM | Admit: 2017-03-27 | Discharge: 2017-03-27 | Disposition: A | Discharge status: Home / Self Care | Payer: BLUE CROSS/BLUE SHIELD | Attending: Student in an Organized Health Care Education/Training Program | Admitting: Student in an Organized Health Care Education/Training Program

## 2017-03-27 ENCOUNTER — Emergency Department: Payer: BLUE CROSS/BLUE SHIELD

## 2017-03-27 DIAGNOSIS — R1011 Right upper quadrant pain: Secondary | ICD-10-CM | POA: Diagnosis not present

## 2017-03-27 DIAGNOSIS — Z79899 Other long term (current) drug therapy: Secondary | ICD-10-CM | POA: Diagnosis not present

## 2017-03-27 DIAGNOSIS — R197 Diarrhea, unspecified: Secondary | ICD-10-CM | POA: Insufficient documentation

## 2017-03-27 DIAGNOSIS — I456 Pre-excitation syndrome: Secondary | ICD-10-CM | POA: Diagnosis not present

## 2017-03-27 DIAGNOSIS — R112 Nausea with vomiting, unspecified: Secondary | ICD-10-CM | POA: Insufficient documentation

## 2017-03-27 HISTORY — DX: Pre-excitation syndrome: I45.6

## 2017-03-27 LAB — CBC
HCT: 41.7 % (ref 35.0–47.0)
Hemoglobin: 14.2 g/dL (ref 12.0–16.0)
MCH: 30.9 pg (ref 26.0–34.0)
MCHC: 34.1 g/dL (ref 32.0–36.0)
MCV: 90.7 fL (ref 80.0–100.0)
PLATELETS: 190 10*3/uL (ref 150–440)
RBC: 4.6 MIL/uL (ref 3.80–5.20)
RDW: 12.9 % (ref 11.5–14.5)
WBC: 7.4 10*3/uL (ref 3.6–11.0)

## 2017-03-27 LAB — COMPREHENSIVE METABOLIC PANEL
ALT: 18 U/L (ref 14–54)
AST: 19 U/L (ref 15–41)
Albumin: 4.6 g/dL (ref 3.5–5.0)
Alkaline Phosphatase: 46 U/L (ref 38–126)
Anion gap: 9 (ref 5–15)
BUN: 17 mg/dL (ref 6–20)
CHLORIDE: 104 mmol/L (ref 101–111)
CO2: 26 mmol/L (ref 22–32)
Calcium: 9.7 mg/dL (ref 8.9–10.3)
Creatinine, Ser: 0.84 mg/dL (ref 0.44–1.00)
GFR calc Af Amer: >60 mL/min (ref >60)
GFR calc non Af Amer: >60 mL/min (ref >60)
Glucose, Bld: 111 mg/dL — ABNORMAL HIGH (ref 65–99)
Potassium: 4 mmol/L (ref 3.5–5.1)
SODIUM: 139 mmol/L (ref 135–145)
Total Bilirubin: 1.8 mg/dL — ABNORMAL HIGH (ref 0.3–1.2)
Total Protein: 7.5 g/dL (ref 6.5–8.1)

## 2017-03-27 LAB — URINALYSIS, COMPLETE (UACMP) WITH MICROSCOPIC
BACTERIA UA: NONE SEEN
Bilirubin Urine: NEGATIVE
Glucose, UA: NEGATIVE mg/dL
Ketones, ur: NEGATIVE mg/dL
Leukocytes, UA: NEGATIVE
Nitrite: NEGATIVE
PH: 6 (ref 5.0–8.0)
PROTEIN: NEGATIVE mg/dL
Specific Gravity, Urine: 1.021 (ref 1.005–1.030)

## 2017-03-27 LAB — LIPASE, BLOOD: LIPASE: 26 U/L (ref 11–51)

## 2017-03-27 LAB — POC URINE PREG, ED: PREG TEST UR: NEGATIVE

## 2017-03-27 MED ORDER — PROMETHAZINE HCL 25 MG/ML IJ SOLN
25.0000 mg | Freq: Four times a day (QID) | INTRAMUSCULAR | Status: DC | PRN
  Administered 2017-03-27: 25 mg via INTRAMUSCULAR
  Filled 2017-03-27: qty 1

## 2017-03-27 MED ORDER — PROMETHAZINE HCL 12.5 MG PO TABS: 12.5000 mg | ORAL_TABLET | Freq: Four times a day (QID) | ORAL | 0 refills | Status: DC | PRN

## 2017-03-27 NOTE — ED Triage Notes (Signed)
Pt reports having been having abd pain, diarrhea, and vomiting x 1 week. Pt reports pain increases after eating. No fevers reported but pt denies having checked temp.

## 2017-03-27 NOTE — ED Notes (Signed)
Pt ambulatory to US

## 2017-03-27 NOTE — ED Provider Notes (Signed)
[facility] Emergency Department Provider Note    First MD Initiated Contact with Patient 03/27/17 1910     (approximate)  I have reviewed the triage vital signs and the nursing notes.   HISTORY  Chief Complaint Emesis and Diarrhea    HPI Katherine Schultz is a 21 y.o. female with a history of WPW presents with several days of persistent nausea and vomiting.  Patient states that symptoms started after she ate Sunday set up eggs at [employer] over the weekend and had associated with nonbloody several episodes of intermittent crampy abdominal pain and diarrhea.  Denies any dysuria or vaginal discharge.  Denies any chance of being pregnant.  States that since then she has had persistent episodes of nausea and anytime she eats something she has right upper quadrant and epigastric pain.  Does have significant family history of gallstones.  Denies any fevers at home.  No pain radiating through to her back.  Describes the pain is moderate to severe but her primary complaint is nausea and decreased appetite.  Past Medical History:  Diagnosis Date  . Arrhythmia    Tachycardia  . Wolff-Parkinson-White (WPW) syndrome    History reviewed. No pertinent family history. History reviewed. No pertinent surgical history. Patient Active Problem List   Diagnosis Date Noted  . Hip flexor tightness 01/20/2014  . 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 Nonallopathic lesion of lumbosacral region 01/20/2014  . Nonallopathic lesion of thoracic region 01/20/2014  . Nonallopathic lesion of cervical region 01/20/2014  . Tachycardia, unspecified 10/22/2012      Prior to Admission medications   Medication Sig Start Date End Date Taking? Authorizing Provider  amoxicillin (AMOXIL) 500 MG capsule Take 1 capsule (500 mg total) by mouth 3 (three) times daily. 12/26/16   Shambley, Melody N, CNM  imipramine (TOFRANIL) 10 MG tablet Take 10 mg by mouth at bedtime.    [provider]    metoprolol succinate (TOPROL-XL) 25 MG 24 hr tablet Take 1 tablet (25 mg total) by mouth daily. 09/08/13   Iran OuchArida, Muhammad A, MD  pentosan polysulfate (ELMIRON) 100 MG capsule Take 100 mg by mouth 3 (three) times daily.    [provider]  phenazopyridine (PYRIDIUM) 97 MG tablet Take 1 tablet (97 mg total) by mouth 3 (three) times daily as needed for pain. 12/22/16   Purcell NailsShambley, Melody N, CNM    Allergies Patient has no known allergies.    Social History Social History   Tobacco Use  . Smoking status: Never Smoker  . Smokeless tobacco: Never Used  Substance Use Topics  . Alcohol use: No  . Drug use: No    Review of Systems Patient denies headaches, rhinorrhea, blurry vision, numbness, shortness of breath, chest pain, edema, cough, abdominal pain, nausea, vomiting, diarrhea, dysuria, fevers, rashes or hallucinations unless otherwise stated above in HPI. ____________________________________________   PHYSICAL EXAM:  VITAL SIGNS: Vitals:   03/27/17 1735  BP: 105/69  Pulse: 95  Resp: 18  Temp: 99 F (37.2 C)  SpO2: 100%    Constitutional: Alert and oriented. Well appearing and in no acute distress. Eyes: Conjunctivae are normal.  Head: Atraumatic. Nose: No congestion/rhinnorhea. Mouth/Throat: Mucous membranes are moist.   Neck: No stridor. Painless ROM.  Cardiovascular: Normal rate, regular rhythm. Grossly normal heart sounds.  Good peripheral circulation. Respiratory: Normal respiratory effort.  No retractions. Lungs CTAB. Gastrointestinal: Soft with mild epigastric ttp. No distention. No abdominal bruits. No CVA tenderness. Genitourinary:  Musculoskeletal: No lower extremity  tenderness nor edema.  No joint effusions. Neurologic:  Normal speech and language. No gross focal neurologic deficits are appreciated. No facial droop Skin:  Skin is warm, dry and intact. No rash noted. Psychiatric: Mood and affect are normal. Speech and behavior are  normal.  ____________________________________________   LABS (all labs ordered are listed, but only abnormal results are displayed)  Results for orders placed or performed during the hospital encounter of 03/27/17 (from the past 24 hour(s))  Lipase, blood     Status: None   Collection Time: 03/27/17  5:38 PM  Result Value Ref Range   Lipase 26 11 - 51 U/L  Comprehensive metabolic panel     Status: Abnormal   Collection Time: 03/27/17  5:38 PM  Result Value Ref Range   Sodium 139 135 - 145 mmol/L   Potassium 4.0 3.5 - 5.1 mmol/L   Chloride 104 101 - 111 mmol/L   CO2 26 22 - 32 mmol/L   Glucose, Bld 111 (H) 65 - 99 mg/dL   BUN 17 6 - 20 mg/dL   Creatinine, Ser 1.61 0.44 - 1.00 mg/dL   Calcium 9.7 8.9 - 09.6 mg/dL   Total Protein 7.5 6.5 - 8.1 g/dL   Albumin 4.6 3.5 - 5.0 g/dL   AST 19 15 - 41 U/L   ALT 18 14 - 54 U/L   Alkaline Phosphatase 46 38 - 126 U/L   Total Bilirubin 1.8 (H) 0.3 - 1.2 mg/dL   GFR calc non Af Amer >60 >60 mL/min   GFR calc Af Amer >60 >60 mL/min   Anion gap 9 5 - 15  CBC     Status: None   Collection Time: 03/27/17  5:38 PM  Result Value Ref Range   WBC 7.4 3.6 - 11.0 K/uL   RBC 4.60 3.80 - 5.20 MIL/uL   Hemoglobin 14.2 12.0 - 16.0 g/dL   HCT 04.5 40.9 - 81.1 %   MCV 90.7 80.0 - 100.0 fL   MCH 30.9 26.0 - 34.0 pg   MCHC 34.1 32.0 - 36.0 g/dL   RDW 91.4 78.2 - 95.6 %   Platelets 190 150 - 440 K/uL  Urinalysis, Complete w Microscopic     Status: Abnormal   Collection Time: 03/27/17  5:38 PM  Result Value Ref Range   Color, Urine YELLOW (A) YELLOW   APPearance HAZY (A) CLEAR   Specific Gravity, Urine 1.021 1.005 - 1.030   pH 6.0 5.0 - 8.0   Glucose, UA NEGATIVE NEGATIVE mg/dL   Hgb urine dipstick SMALL (A) NEGATIVE   Bilirubin Urine NEGATIVE NEGATIVE   Ketones, ur NEGATIVE NEGATIVE mg/dL   Protein, ur NEGATIVE NEGATIVE mg/dL   Nitrite NEGATIVE NEGATIVE   Leukocytes, UA NEGATIVE NEGATIVE   RBC / HPF 0-5 0 - 5 RBC/hpf   WBC, UA 0-5 0 - 5  WBC/hpf   Bacteria, UA NONE SEEN NONE SEEN   Squamous Epithelial / LPF 0-5 (A) NONE SEEN   Mucus PRESENT   POC urine preg, ED     Status: None   Collection Time: 03/27/17  6:34 PM  Result Value Ref Range   Preg Test, Ur Negative Negative   ____________________________________________  ____________________________________________  RADIOLOGY  I personally reviewed all radiographic images ordered to evaluate for the above acute complaints and reviewed radiology reports and findings.  These findings were personally discussed with the patient.  Please see medical record for radiology report.  ____________________________________________   PROCEDURES  Procedure(s) performed:  Procedures  Critical Care performed: no ____________________________________________   INITIAL IMPRESSION / ASSESSMENT AND PLAN / ED COURSE  Pertinent labs & imaging results that were available during my care of the patient were reviewed by me and considered in my medical decision making (see chart for details).  DDX: Enteritis, cholelithiasis, cholecystitis, pancreatitis, gastritis, UTI, pregnancy  Katherine Schultz is a 21 y.o. who presents to the ED with 1 week of abdominal pain nausea vomiting and diarrhea.  Nonbloody diarrhea.  She is afebrile and hemodynamically stable.  Blood work is reassuring with exception of mildly elevated bilirubin.  Given her history or family history of biliary pathology ordered a right upper quadrant ultrasound she did have some epigastric and right upper quadrant tenderness to palpation.  Right upper quadrant ultrasound is reassuring.  Patient given IM antiemetic.  Abdominal exam is not clinically consistent with appendicitis.  No family history or concerning features to suggest inflammatory bowel disease.  Patient is not pregnant.  Patient otherwise stable for a trial of outpatient management.      ____________________________________________   FINAL CLINICAL  IMPRESSION(S) / ED DIAGNOSES  Final diagnoses:  RUQ abdominal pain  Nausea vomiting and diarrhea      NEW MEDICATIONS STARTED DURING THIS VISIT:  This SmartLink is deprecated. Use AVSMEDLIST instead to display the medication list for a patient.   Note:  This document was prepared using Dragon voice recognition software and may include unintentional dictation errors.    Willy Eddyobinson, Bernie Fobes, MD 03/27/17 2023

## 2017-03-27 NOTE — Discharge Instructions (Signed)

## 2017-08-29 ENCOUNTER — Encounter: Payer: BLUE CROSS/BLUE SHIELD | Admitting: Obstetrics and Gynecology

## 2017-08-31 ENCOUNTER — Encounter: Payer: Self-pay | Admitting: Obstetrics and Gynecology

## 2017-08-31 ENCOUNTER — Ambulatory Visit (INDEPENDENT_AMBULATORY_CARE_PROVIDER_SITE_OTHER): Payer: BLUE CROSS/BLUE SHIELD | Admitting: Obstetrics and Gynecology

## 2017-08-31 VITALS — BP 109/70 | HR 83 | Ht 66.0 in | Wt 156.0 lb

## 2017-08-31 DIAGNOSIS — N926 Irregular menstruation, unspecified: Secondary | ICD-10-CM | POA: Diagnosis not present

## 2017-08-31 NOTE — Progress Notes (Signed)
  Subjective:     Patient ID: Katherine Schultz, female   DOB: Jan 30, 1996, 22 y.o.   MRN: [id number]  HPI Reports menses 18 days with menses, concerned as she was cramping and symptoms. Was under a lot stress, broke up with boyfriend, and started running more. Has been sexually active x 1 episode before menses. Negative home UPT. When menses started cramping improved and it lasted for 6 days with normal floe.  Review of Systems Negative except stated in HPI    Objective:   Physical Exam A&O x4 Well groomed female in no distress Blood pressure 109/70, pulse 83, height  (1.676 m), weight 156 lb (70.8 kg), last menstrual period 08/24/2017. UPT- Thyroid not enlarged Pelvic declined     Assessment:     Late menses    Plan:     Counseled on common causes of delayed onset of menses: stress, weight changes, thyroid disorder, and sleep changes.  Is past due for check up-will schedule in next 4-6 weeks.   Melody Shambley,CNM

## 2017-10-29 DIAGNOSIS — M5430 Sciatica, unspecified side: Secondary | ICD-10-CM | POA: Diagnosis not present

## 2017-10-29 DIAGNOSIS — M9905 Segmental and somatic dysfunction of pelvic region: Secondary | ICD-10-CM | POA: Diagnosis not present

## 2017-10-29 DIAGNOSIS — R51 Headache: Secondary | ICD-10-CM | POA: Diagnosis not present

## 2017-10-29 DIAGNOSIS — M9901 Segmental and somatic dysfunction of cervical region: Secondary | ICD-10-CM | POA: Diagnosis not present

## 2017-11-02 DIAGNOSIS — R51 Headache: Secondary | ICD-10-CM | POA: Diagnosis not present

## 2017-11-02 DIAGNOSIS — M9901 Segmental and somatic dysfunction of cervical region: Secondary | ICD-10-CM | POA: Diagnosis not present

## 2017-11-02 DIAGNOSIS — M9905 Segmental and somatic dysfunction of pelvic region: Secondary | ICD-10-CM | POA: Diagnosis not present

## 2017-11-02 DIAGNOSIS — M5430 Sciatica, unspecified side: Secondary | ICD-10-CM | POA: Diagnosis not present

## 2017-11-12 DIAGNOSIS — R51 Headache: Secondary | ICD-10-CM | POA: Diagnosis not present

## 2017-11-12 DIAGNOSIS — M5430 Sciatica, unspecified side: Secondary | ICD-10-CM | POA: Diagnosis not present

## 2017-11-12 DIAGNOSIS — M9905 Segmental and somatic dysfunction of pelvic region: Secondary | ICD-10-CM | POA: Diagnosis not present

## 2017-11-12 DIAGNOSIS — M9901 Segmental and somatic dysfunction of cervical region: Secondary | ICD-10-CM | POA: Diagnosis not present

## 2017-11-19 DIAGNOSIS — M9901 Segmental and somatic dysfunction of cervical region: Secondary | ICD-10-CM | POA: Diagnosis not present

## 2017-11-19 DIAGNOSIS — R51 Headache: Secondary | ICD-10-CM | POA: Diagnosis not present

## 2017-11-19 DIAGNOSIS — M9905 Segmental and somatic dysfunction of pelvic region: Secondary | ICD-10-CM | POA: Diagnosis not present

## 2017-11-19 DIAGNOSIS — M5430 Sciatica, unspecified side: Secondary | ICD-10-CM | POA: Diagnosis not present

## 2017-12-14 ENCOUNTER — Encounter: Payer: Self-pay | Admitting: Obstetrics and Gynecology

## 2017-12-14 ENCOUNTER — Other Ambulatory Visit: Payer: Self-pay | Admitting: Obstetrics and Gynecology

## 2017-12-14 ENCOUNTER — Other Ambulatory Visit (INDEPENDENT_AMBULATORY_CARE_PROVIDER_SITE_OTHER): Payer: BLUE CROSS/BLUE SHIELD

## 2017-12-14 ENCOUNTER — Ambulatory Visit: Payer: BLUE CROSS/BLUE SHIELD | Admitting: Obstetrics and Gynecology

## 2017-12-14 VITALS — BP 112/82 | HR 77 | Ht 66.0 in | Wt 149.5 lb

## 2017-12-14 DIAGNOSIS — M545 Low back pain: Secondary | ICD-10-CM

## 2017-12-14 DIAGNOSIS — N946 Dysmenorrhea, unspecified: Secondary | ICD-10-CM

## 2017-12-14 DIAGNOSIS — N3011 Interstitial cystitis (chronic) with hematuria: Secondary | ICD-10-CM | POA: Diagnosis not present

## 2017-12-14 DIAGNOSIS — R300 Dysuria: Secondary | ICD-10-CM

## 2017-12-14 DIAGNOSIS — R102 Pelvic and perineal pain: Secondary | ICD-10-CM

## 2017-12-14 DIAGNOSIS — F3281 Premenstrual dysphoric disorder: Secondary | ICD-10-CM

## 2017-12-14 DIAGNOSIS — Z8719 Personal history of other diseases of the digestive system: Secondary | ICD-10-CM

## 2017-12-14 LAB — POCT URINALYSIS DIPSTICK
Bilirubin, UA: NEGATIVE
GLUCOSE UA: NEGATIVE
KETONES UA: NEGATIVE
Nitrite, UA: NEGATIVE
Protein, UA: POSITIVE — AB
Spec Grav, UA: 1.01 (ref 1.010–1.025)
Urobilinogen, UA: 0.2 E.U./dL
pH, UA: 6.5 (ref 5.0–8.0)

## 2017-12-14 MED ORDER — LEVONORGEST-ETH ESTRAD 91-DAY 0.1-0.02 & 0.01 MG PO TABS: 1.0000 | ORAL_TABLET | Freq: Every day | ORAL | 4 refills | Status: DC

## 2017-12-14 MED ORDER — NAPROXEN 500 MG PO TABS: 500.0000 mg | ORAL_TABLET | Freq: Two times a day (BID) | ORAL | 2 refills | Status: DC

## 2017-12-14 NOTE — Progress Notes (Signed)
  Subjective:     Patient ID: Myna Hidalgo, female   DOB: 05/18/1995, 22 y.o.   MRN: [id number]  HPI Started menses 3 days ago, and had worse pain before menses for a week before. Taking midol complete ( a lot) but it isn't helping. States monthly menses precipitated by low back pain, achiness that radiates down both thighs, increased mood swings, fatigue, heavy menses and lower pelvic pain, and it flares up her IC. Feels really tired and emotional. It has progressively gotten worse over the last year. Typically can't do normal activities right before and during menses.  Is trying to manage IC with dietary changes and tumeric. Denies seeing any blood in urine in some time.   IC has been under control, but flares up with menses.  Review of Systems  Constitutional: Positive for fatigue.  HENT: Negative.   Eyes: Negative.   Respiratory: Negative.   Cardiovascular: Positive for palpitations.  Gastrointestinal: Positive for abdominal distention and abdominal pain.  Endocrine: Negative.   Genitourinary: Positive for dysuria, flank pain, frequency, hematuria, menstrual problem, pelvic pain, urgency and vaginal pain.  Allergic/Immunologic: Positive for food allergies.  Neurological: Negative.   Hematological: Negative.   Psychiatric/Behavioral: Negative.        Objective:   Physical Exam A&Ox4 Well groomed female in no distress Blood pressure 112/82, pulse 77, height '5\' 6"'$  (1.676 m), weight 149 lb 8 oz (67.8 kg), last menstrual period 12/11/2017. HRR abdomen soft and tender in both lower quadrant. Bilateral flank pain (but appears superficial and musclular) Pelvic ultrasound today reveals: Findings:  The uterus measures 7.1 x 5.2 x 2.9 cm. Echo texture is homogeneous without evidence of focal masses.  The Endometrium measures 2.5 mm.  Right Ovary measures 3.5 x 1.7 x 1.8 cm. It is normal in appearance. Left Ovary measures 2.2 x 1.7 x 1.1 cm. It is normal appearance. Survey of the  adnexa demonstrates no adnexal masses. There is no free fluid in the cul de sac.  Impression: 1. Anteflexed uterus appears of normal size and contour. 2. Endometrium measures 2.5 mm 3. Bilateral ovaries appear WNL.    Assessment:     IC Dysmenorrhea PMDD IBS     Plan:     Counseled at length on todays findings and reassured of nothing life threatening, although it is altering ADLs.  Recommend cessation of Midol and trial of Naproxen, and trial of OCPs for menstrual suppression with continuous cycling. Discussed studies showing it improving IC symptoms too.  Labs obtained to rule out other etiology. Will follow up accordingly.   RTC in 10 weeks for physical and recheck.   Tavarus Poteete,CNM

## 2017-12-14 NOTE — Patient Instructions (Signed)
[email or web address] 

## 2017-12-18 ENCOUNTER — Other Ambulatory Visit: Payer: Self-pay | Admitting: Obstetrics and Gynecology

## 2017-12-18 DIAGNOSIS — E559 Vitamin D deficiency, unspecified: Secondary | ICD-10-CM | POA: Insufficient documentation

## 2017-12-18 LAB — B12 AND FOLATE PANEL
FOLATE: 11.6 ng/mL (ref >3.0)
Vitamin B-12: 400 pg/mL (ref 232–1245)

## 2017-12-18 LAB — COMPREHENSIVE METABOLIC PANEL
A/G RATIO: 2.5 — AB (ref 1.2–2.2)
ALBUMIN: 5 g/dL (ref 3.5–5.5)
ALT: 10 IU/L (ref 0–32)
AST: 14 IU/L (ref 0–40)
Alkaline Phosphatase: 41 IU/L (ref 39–117)
BUN / CREAT RATIO: 13 (ref 9–23)
BUN: 13 mg/dL (ref 6–20)
Bilirubin Total: 1.4 mg/dL — ABNORMAL HIGH (ref 0.0–1.2)
CALCIUM: 9.7 mg/dL (ref 8.7–10.2)
CO2: 22 mmol/L (ref 20–29)
CREATININE: 1.02 mg/dL — AB (ref 0.57–1.00)
Chloride: 102 mmol/L (ref 96–106)
GFR calc Af Amer: 89 mL/min/{1.73_m2} (ref >59)
GFR, EST NON AFRICAN AMERICAN: 77 mL/min/{1.73_m2} (ref >59)
GLOBULIN, TOTAL: 2 g/dL (ref 1.5–4.5)
Glucose: 75 mg/dL (ref 65–99)
POTASSIUM: 4.4 mmol/L (ref 3.5–5.2)
SODIUM: 139 mmol/L (ref 134–144)
TOTAL PROTEIN: 7 g/dL (ref 6.0–8.5)

## 2017-12-18 LAB — FOOD ALLERGY PROFILE
Allergen Corn, IgE: <0.1 kU/L
Codfish IgE: <0.1 kU/L
Egg White IgE: <0.1 kU/L
Milk IgE: <0.1 kU/L
Shrimp IgE: <0.1 kU/L
Walnut IgE: <0.1 kU/L
Wheat IgE: <0.1 kU/L

## 2017-12-18 LAB — CBC
HEMOGLOBIN: 13.7 g/dL (ref 11.1–15.9)
Hematocrit: 42.4 % (ref 34.0–46.6)
MCH: 30.7 pg (ref 26.6–33.0)
MCHC: 32.3 g/dL (ref 31.5–35.7)
MCV: 95 fL (ref 79–97)
PLATELETS: 217 10*3/uL (ref 150–450)
RBC: 4.46 x10E6/uL (ref 3.77–5.28)
RDW: 13.7 % (ref 12.3–15.4)
WBC: 5.1 10*3/uL (ref 3.4–10.8)

## 2017-12-18 LAB — HISTAMINE DETERMINATION, BLOOD: Histamine Determination, Blood: 77 ng/mL (ref 12–127)

## 2017-12-18 LAB — VITAMIN D 25 HYDROXY (VIT D DEFICIENCY, FRACTURES): Vit D, 25-Hydroxy: 21.8 ng/mL — ABNORMAL LOW (ref 30.0–100.0)

## 2017-12-18 LAB — FERRITIN: Ferritin: 26 ng/mL (ref 15–150)

## 2017-12-18 MED ORDER — VITAMIN D (ERGOCALCIFEROL) 1.25 MG (50000 UNIT) PO CAPS: 50000.0000 [IU] | ORAL_CAPSULE | ORAL | 1 refills | Status: DC

## 2018-01-21 ENCOUNTER — Ambulatory Visit: Payer: BLUE CROSS/BLUE SHIELD | Admitting: Urology

## 2018-01-21 ENCOUNTER — Encounter: Payer: Self-pay | Admitting: Urology

## 2018-01-21 VITALS — BP 115/78 | HR 86 | Ht 66.0 in | Wt 156.4 lb

## 2018-01-21 DIAGNOSIS — N3020 Other chronic cystitis without hematuria: Secondary | ICD-10-CM

## 2018-01-21 DIAGNOSIS — N3946 Mixed incontinence: Secondary | ICD-10-CM

## 2018-01-21 NOTE — Progress Notes (Signed)
01/21/2018 3:29 PM   Katherine Schultz 1995/10/10 [id number]  Referring provider: Purcell Nails, CNM [address] [place], [place] [address]  Chief Complaint  Patient presents with  . Cystitis    HPI: Dr Achilles Dunk: Diagnosed with interstitial cystitis and apparently improved after hydrodistention.  Physical therapy had been ordered.  Interstitial cystitis diet given. Given Elmiron.  Had been given imipramine  Today The patient's pain is much more consistent in the last 2 months.  It is suprapubic.  It is worse when she urinates.  It is daily.  She voids every 2 or 3 hours with no nocturia.  She is not sexually active  She does not think she ever took the [employer].  She thought imipramine helped minimally.  She thinks the hydrodistention helped for a while with less pain afterwards.  Physical therapy here in [place] helped a lot.  She has an excellent diet  No neurologic issues.  No hysterectomy  Modifying factors: There are no other modifying factors  Associated signs and symptoms: There are no other associated signs and symptoms Aggravating and relieving factors: There are no other aggravating or relieving factors Severity: Moderate Duration: Persistent   PMH: Past Medical History:  Diagnosis Date  . Arrhythmia    Tachycardia  . GERD (gastroesophageal reflux disease)   . IC (interstitial cystitis)   . Wolff-Parkinson-White (WPW) syndrome     Surgical History: Past Surgical History:  Procedure Laterality Date  . CYSTOSCOPY  2017  . WISDOM TOOTH EXTRACTION  2012    Home Medications:  Allergies as of 01/21/2018   No Known Allergies     Medication List        Accurate as of 01/21/18  3:29 PM. Always use your most recent med list.          Levonorgestrel-Ethinyl Estradiol 0.1-0.02 & 0.01 MG tablet Commonly known as:  AMETHIA,CAMRESE Take 1 tablet by mouth daily.   naproxen 500 MG tablet Commonly known as:  NAPROSYN Take 1 tablet (500 mg  total) by mouth 2 (two) times daily with a meal. As needed for pain   Vitamin D (Ergocalciferol) 50000 units Caps capsule Commonly known as:  DRISDOL Take 1 capsule (50,000 Units total) by mouth every 7 (seven) days.       Allergies: No Known Allergies  Family History: No family history on file.  Social History:  reports that she has never smoked. She has never used smokeless tobacco. She reports that she does not drink alcohol or use drugs.  ROS: UROLOGY Frequent Urination?: No Hard to postpone urination?: No Burning/pain with urination?: Yes Get up at night to urinate?: No Leakage of urine?: No Urine stream starts and stops?: No Trouble starting stream?: No Do you have to strain to urinate?: No Blood in urine?: No Urinary tract infection?: No Sexually transmitted disease?: No Injury to kidneys or bladder?: No Painful intercourse?: No Weak stream?: No Currently pregnant?: No Vaginal bleeding?: No Last menstrual period?: 12/11/2017  Gastrointestinal Nausea?: No Vomiting?: No Indigestion/heartburn?: Yes Diarrhea?: No Constipation?: No  Constitutional Fever: No Night sweats?: No Weight loss?: No Fatigue?: No  Skin Skin rash/lesions?: No Itching?: No  Eyes Blurred vision?: No Double vision?: No  Ears/Nose/Throat Sore throat?: No Sinus problems?: No  Hematologic/Lymphatic Swollen glands?: No Easy bruising?: No  Cardiovascular Leg swelling?: No Chest pain?: No  Respiratory Cough?: No Shortness of breath?: No  Endocrine Excessive thirst?: No  Musculoskeletal Back pain?: Yes Joint pain?: No  Neurological Headaches?: No  Dizziness?: No  Psychologic Depression?: No Anxiety?: No  Physical Exam: BP 115/78 (BP Location: Left Arm, Patient Position: Sitting, Cuff Size: Normal)   Pulse 86   Ht 5\' 6"  (1.676 m)   Wt 156 lb 6.4 oz (70.9 kg)   BMI 25.24 kg/m   Constitutional:  Alert and oriented, No acute distress. HEENT: [place] AT, moist mucus  membranes.  Trachea midline, no masses. Cardiovascular: No clubbing, cyanosis, or edema. Respiratory: Normal respiratory effort, no increased work of breathing. GI: Abdomen is soft, nontender, nondistended, no abdominal masses GU: No prolapse or diverticulum.  No bladder or levator tenderness.  Moderate normal vaginal discharge for patient Skin: No rashes, bruises or suspicious lesions. Lymph: No cervical or inguinal adenopathy. Neurologic: Grossly intact, no focal deficits, moving all 4 extremities. Psychiatric: Normal mood and affect.  Laboratory Data: Lab Results  Component Value Date   WBC 5.1 12/14/2017   HGB 13.7 12/14/2017   HCT 42.4 12/14/2017   MCV 95 12/14/2017   PLT 217 12/14/2017    Lab Results  Component Value Date   CREATININE 1.02 (H) 12/14/2017    No results found for: PSA  No results found for: TESTOSTERONE  No results found for: HGBA1C  Urinalysis    Component Value Date/Time   COLORURINE YELLOW (A) 03/27/2017 1738   APPEARANCEUR HAZY (A) 03/27/2017 1738   LABSPEC 1.021 03/27/2017 1738   PHURINE 6.0 03/27/2017 1738   GLUCOSEU NEGATIVE 03/27/2017 1738   HGBUR SMALL (A) 03/27/2017 1738   BILIRUBINUR neg 12/14/2017 0916   KETONESUR NEGATIVE 03/27/2017 1738   PROTEINUR Positive (A) 12/14/2017 0916   PROTEINUR NEGATIVE 03/27/2017 1738   UROBILINOGEN 0.2 12/14/2017 0916   NITRITE neg 12/14/2017 0916   NITRITE NEGATIVE 03/27/2017 1738   LEUKOCYTESUR Small (1+) (A) 12/14/2017 0916    Pertinent Imaging:   Assessment & Plan: Patient has interstitial cystitis.  Multimodality therapy discussed.  Role of repeating hydrodistention discussed.  Patient shows physical therapy consultation and at this stage no hydrodistention or other medication.  She will work harder on her diet and I will see her as needed  There are no diagnoses linked to this encounter.  No follow-ups on file.  Martina Sinner, MD  [facility] [address], [address] [place], [place] [address] [phone]

## 2018-01-22 NOTE — Addendum Note (Signed)
Addended by: Frankey Shown on: 01/22/2018 10:40 AM   Modules accepted: Orders

## 2018-03-01 ENCOUNTER — Ambulatory Visit: Payer: BLUE CROSS/BLUE SHIELD | Admitting: Physical Therapy

## 2018-03-08 ENCOUNTER — Encounter: Payer: BLUE CROSS/BLUE SHIELD | Admitting: Physical Therapy

## 2018-03-08 ENCOUNTER — Ambulatory Visit: Payer: BLUE CROSS/BLUE SHIELD | Attending: Urology | Admitting: Physical Therapy

## 2018-03-08 DIAGNOSIS — M62838 Other muscle spasm: Secondary | ICD-10-CM

## 2018-03-08 DIAGNOSIS — M533 Sacrococcygeal disorders, not elsewhere classified: Secondary | ICD-10-CM | POA: Diagnosis not present

## 2018-03-08 DIAGNOSIS — R293 Abnormal posture: Secondary | ICD-10-CM | POA: Diagnosis not present

## 2018-03-08 NOTE — Patient Instructions (Addendum)
Posture:  Points of contact: standing: more across the ballmounds, soft knees,                               Sitting, feet under knees, sitting  Bones on chair                               Sitting in car seat: heel on floor, shoulders back of head, back of the both hips    Rest stops with stretches every 1.5 hours  ____  Open book ( handout)   Deep core level 1 ( breathing with pelvic floor relaxation)     ____   Avoid straining pelvic floor, abdominal muscles , spine  Use log rolling technique instead of getting out of bed with your neck or the sit-up   Log rolling into and out of .bed  With sidelying position first

## 2018-03-08 NOTE — Therapy (Signed)
[place] [facility] MAIN [facility] SERVICES [address] [place], [place], [address] Phone: [phone]   Fax:  [phone]  Physical Therapy Evaluation  Patient Details  Name: Katherine Schultz MRN: [id number] Date of Birth: Jan 20, 1996 Referring Provider (PT): MacDiarmid    Encounter Date: 03/08/2018  PT End of Session - 03/08/18 0815    Visit Number  1    Number of Visits  12    Date for PT Re-Evaluation  05/31/18    PT Start Time  0810    PT Stop Time  0910    PT Time Calculation (min)  60 min    Activity Tolerance  Patient tolerated treatment well    Behavior During Therapy  [facility] for tasks assessed/performed       Past Medical History:  Diagnosis Date  . Arrhythmia    Tachycardia  . GERD (gastroesophageal reflux disease)   . IC (interstitial cystitis)   . Wolff-Parkinson-White (WPW) syndrome     Past Surgical History:  Procedure Laterality Date  . CYSTOSCOPY  2017  . WISDOM TOOTH EXTRACTION  2012    There were no vitals filed for this visit.   Subjective Assessment - 03/08/18 0816    Subjective 1)  Pelvic pain started back in Jan 2019 when she started running to train for a half marathon. Seven months later, it became worst gradually.  During running, the pain was uncomfortable and she was able to push through it at 2-4/10.  In July, pt's pain worsened to 9-10/10 to the point where she had to collapse to the ground and not be able to walk. Someone would have to pick her up. Pt would have to have a heating pad until the pain subside to 3-4/10.  Pt felt she had run a marathon but had only ran a mile. The pain feels like "stabbing" "burning" and as it starts to subside "dull achey pain".  Today' pain1-2/10.  Morning pain 4/10. Pt no longer is running. Pt had a stretch routine prior ( 5-6 min) and after running 10 min).  No crunches nor sit-ups in routine because she tried and it did not feel right. The pelvic pain used to occur with menstrual cycle  prior/during, and after. Period lasted 5-6 days with heavy bleeding. This pain felt more like bladder pain. Hx of IC. Pt was plaed on a birth control pill 3 months ago to address period pain but it has increased her pelvic pain. Pt is unable to differentiate between period pain versus bladder pain. 5-6/10 "Light version of the stabbing and burning with intense cramping with general achiness in her low back" .   2)  Frequent urgency with urination has increased recently which occurs in the middle of the night. Pt feels the urge but do not get up because she falls back asleep which makes her to feel a alot of pelvic pain upon waking due to a full bladder. Pt has no incontinence with urge nor stress.  Pt feels fatigued upon waking due to feeling. "Having a constant level of pain all the time has exhausted my body out."  Pt had a tendency as a kid to not use the bathroom because she did not like using public bathrooms. Pt no longer has this issue and can relax in public bathrooms.  If pt does not go right away when she feels the urge, pt will difficulty starting the flow with need to strain. Bowel movements are normal. Pt is paying attention to her  food for IC diet and avoid trigger foods which has helped with burning sensation.    3)  Dull achey low back pain 4/10 which has occurred since middle school. It occurs with sitting at her desk, standing for long periods of time 10-15 min.        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  Pt incorporates stretches prior to her classes not after.   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           [facility] PT Assessment - 03/08/18 0854      Assessment   Medical Diagnosis  Chronic Cystitis     Referring Provider (PT)  MacDiarmid       Precautions   Precautions  None      Restrictions   Weight Bearing Restrictions  No      Balance Screen   Has the patient fallen in the past 6 months  No       Observation/Other Assessments   Observations  hyperextended knees       Coordination   Gross Motor Movements are Fluid and Coordinated  --   chest breathing ( supine)   Fine Motor Movements are Fluid and Coordinated  --   overuse adductor mm w/ cue for pelvic floor mm      Posture/Postural Control   Posture Comments  dyscoordination of pelvic floor mm       Strength   Overall Strength Comments  hip flexion 4-/5 with trunk compenstion B, gross BLE 5/5. hip abd/ glut 5/5, posterior sling n: no perurbation         Palpation   Spinal mobility  hypomobility in throacic T/L junction     SI assessment   FADDIR B with hypomobility    Palpation comment  L PSIS more posterior .  Tightness at R pelvic floor external palpation ( post Tx: decreased)       Ambulation/Gait   Gait Comments  hip IR in bilateral stance                 Objective measurements completed on examination: See above findings.      [facility] Adult PT Treatment/Exercise - 03/08/18 0854      Neuro Re-ed    Neuro Re-ed Details   see pt instructions       Manual Therapy   Manual therapy comments  FADDIR AP mob B                    PT Long Term Goals - 03/08/18 1622      PT LONG TERM GOAL #1   Title  Pt will decrease her ZUNG anixety score form % to <% in order to demo improved IND with relaxation practices to minimize Sx    Time  12    Period  Weeks    Status  New    Target Date  05/31/18      PT LONG TERM GOAL #2   Title  Pt will decrease PSQI score from 71% to < 35% inorder to improve sleep quality and wellness    Time  10    Period  Weeks    Status  New    Target Date  05/31/18      PT LONG TERM GOAL #3   Title  Pt will decrease NIH CPSI 49 % to < 25% in order to improve  urination and pain     Time  12    Period  Weeks    Status  New    Target Date  05/31/18      PT LONG TERM GOAL #4   Title  Pt will demo decrease hypomobility of thoracic segments, increase SIJ mobility,  decrease R pelvic floor mm tightness in order to progress towards deep core level 2     Time  2    Period  Weeks    Status  New    Target Date  03/22/18      PT LONG TERM GOAL #5   Title  Pt will demo proper alignment/ modifications to standing/sitting posture, fitness/ dance moves, and running mechanics without cues to demo IND with co-activation of deep core and lower  kinetich chain to minimzie overuse of pelvic floor tightness     Time  8    Period  Weeks    Status  New    Target Date  05/17/18      Additional Long Term Goals   Additional Long Term Goals  Yes      PT LONG TERM GOAL #6   Title  Pt will report no low back pain across 1 week in order to perform work tasks     Time  4    Period  Weeks    Status  New    Target Date  04/05/18      PT LONG TERM GOAL #7   Title  Pt will show proper pelvic floor lengthening and contraction without adductor overuse in order to eliminate urine without straining and to return to running     Time  2    Period  Weeks    Status  New    Target Date  03/22/18             Plan - 03/08/18 1620    Clinical Impression Statement  Pt is a 22 yo with report of chronic pelvic pain, CLBP, and nighttime frequency of urinary urgency. These Sx impact her QOL and ADLs, and sleep quality. Pt's clinical presentations include dyscoordination of pelvic floor, SIJ and thoracic hypomobility, R pelvic floor mm tensions, poor posture and mechanics which strain pelvic floor mm, and lacks flexibility routine. Following Tx today, pt has decreased R pelvic floor mm tensions, increased SIJ mobility, and improve pelvic floor lengthening along with improve posture in sitting, standing, and t/f from supine to sit.    Clinical Presentation  Evolving    Clinical Decision Making  Moderate    Rehab Potential  Good    PT Frequency  1x / week    PT Duration  12 weeks    PT Treatment/Interventions  Therapeutic activities;Therapeutic exercise;Balance training;Aquatic  Therapy;Electrical Stimulation;Moist Heat;Neuromuscular re-education;Scar mobilization;Manual lymph drainage;Patient/family education;Stair training;Taping;Manual techniques;Functional mobility training    Consulted and Agree with Plan of Care  Patient       Patient will benefit from skilled therapeutic intervention in order to improve the following deficits and impairments:  Impaired sensation, Improper body mechanics, Pain, Increased muscle spasms, Decreased mobility, Decreased coordination, Impaired UE functional use, Hypomobility, Decreased strength, Decreased range of motion, Decreased endurance, Decreased activity tolerance, Hypermobility, Postural dysfunction  Visit Diagnosis: Sacrococcygeal disorders, not elsewhere classified  Other muscle spasm  Abnormal posture     Problem List Patient Active Problem List   Diagnosis Date Noted  . Vitamin D deficiency 12/18/2017  . Interstitial cystitis 08/04/2015  . Pelvic floor dysfunction 08/04/2015  .  Dysmenorrhea 05/24/2015  . Preop testing 05/24/2015  . Vestibulitis, vulvar 05/24/2015  . Dysuria 11/26/2014  . Renal colic 07/03/2014  . WPW (Wolff-Parkinson-White syndrome) 03/18/2014  . Hip flexor tightness 01/20/2014  . 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 Nonallopathic lesion of lumbosacral region 01/20/2014  . Nonallopathic lesion of thoracic region 01/20/2014  . Nonallopathic lesion of cervical region 01/20/2014  . Dyspepsia 04/07/2013  . Tachycardia, unspecified 10/22/2012  . Constipation 10/10/2012  . History of anal fissures 10/10/2012  . Lactose intolerance 03/23/2011  . Vasovagal syncope 03/23/2011    Mariane Masters 03/08/2018, 4:34 PM  [facility] MAIN [facility] SERVICES [address] [place], [place], [address] Phone: [phone]   Fax:  [phone]  Name: Katherine Schultz MRN: [id number] Date of Birth: 1995-10-02

## 2018-03-12 ENCOUNTER — Ambulatory Visit: Payer: BLUE CROSS/BLUE SHIELD | Admitting: Physical Therapy

## 2018-03-12 DIAGNOSIS — M533 Sacrococcygeal disorders, not elsewhere classified: Secondary | ICD-10-CM | POA: Diagnosis not present

## 2018-03-12 DIAGNOSIS — M62838 Other muscle spasm: Secondary | ICD-10-CM

## 2018-03-12 DIAGNOSIS — R293 Abnormal posture: Secondary | ICD-10-CM

## 2018-03-12 NOTE — Patient Instructions (Addendum)
   Create a relaxation time in afternoon everyday ( use a calm sounding alarm on the phone for 15 min  _ use ocean sounds which you associate relaxation with  _ notice your body sinking to the bed, arms out, letting go   PROP your knees/legs up on pillows to elevator   _beginning long paced breathing which will help with relaxing your body   _ allow yourself to relax unto the surface beneath you   When the alarm go off, don't immediate get up to turn it off,  Move gradually, log roll to the side, and then sit up.

## 2018-03-12 NOTE — Therapy (Signed)
[facility] MAIN [facility] SERVICES [address] [place], [place], [address] Phone: [phone]   Fax:  [phone]  Physical Therapy Treatment  Patient Details  Name: Katherine Schultz MRN: [id number] Date of Birth: [birth year] Referring Provider (PT): MacDiarmid    Encounter Date: 03/12/2018  PT End of Session - 03/12/18 1501    Visit Number  2    Number of Visits  12    Date for PT Re-Evaluation  05/31/18    PT Start Time  1403    PT Stop Time  1501    PT Time Calculation (min)  58 min    Activity Tolerance  Patient tolerated treatment well    Behavior During Therapy  [facility] for tasks assessed/performed       Past Medical History:  Diagnosis Date  . Arrhythmia    Tachycardia  . GERD (gastroesophageal reflux disease)   . IC (interstitial cystitis)   . Wolff-Parkinson-White (WPW) syndrome     Past Surgical History:  Procedure Laterality Date  . CYSTOSCOPY  2017  . WISDOM TOOTH EXTRACTION  2012    There were no vitals filed for this visit.  Subjective Assessment - 03/12/18 1410    Subjective  Pt reported she felt less pain in her low back / groin area than she typically would feel.  Pt is noticing muscles she has not used before when unlocking her knees with standing. It has helped her knee pain as well.  Pt is feeling period pain 6-7/10 today with some bloody discharge which has been on and off randomly. Pt will communicate with her OB/GYN today.       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  Pt incorporates stretches prior to her classes not after.   Works as a Production designer, theatre/television/film with some standing and sitting.           [facility] PT Assessment - 03/12/18 1448      Coordination   Gross Motor Movements are Fluid and Coordinated  --   improved lowering of thorax/ scapular depression     Strength   Overall Strength Comments  hip abd 4-/5 B, L hip ext L 3+/5 ( post Tx: 4/5), R 4/5        Palpation   Spinal mobility  hypomobility at thoracic spine R > L, interspinals/ intercostal mm/ teres minor/subcapsularius L     Palpation comment  levelled pelvic alignment                Pelvic Floor Special Questions - 03/12/18 0001    External Palpation  increased tensiosn at ischial rami R ( post Tx: decreased)         [facility] Adult PT Treatment/Exercise - 03/12/18 1451      Neuro Re-ed    Neuro Re-ed Details   see pt instructions      Moist Heat Therapy   Moist Heat Location  Other (comment)      Manual Therapy   Manual Therapy  --   ab   Manual therapy comments  STM/MWM at problem areas noted in assessment                   PT Long Term Goals - 03/08/18 1622      PT LONG TERM GOAL #1   Title  Pt will decrease her ZUNG anixety score form % to <% in order to demo improved IND with relaxation  practices to minimize Sx    Time  12    Period  Weeks    Status  New    Target Date  05/31/18      PT LONG TERM GOAL #2   Title  Pt will decrease PSQI score from 71% to < 35% inorder to improve sleep quality and wellness    Time  10    Period  Weeks    Status  New    Target Date  05/31/18      PT LONG TERM GOAL #3   Title  Pt will decrease NIH CPSI 49 % to < 25% in order to improve urination and pain     Time  12    Period  Weeks    Status  New    Target Date  05/31/18      PT LONG TERM GOAL #4   Title  Pt will demo decrease hypomobility of thoracic segments, increase SIJ mobility, decrease R pelvic floor mm tightness in order to progress towards deep core level 2     Time  2    Period  Weeks    Status  New    Target Date  03/22/18      PT LONG TERM GOAL #5   Title  Pt will demo proper alignment/ modifications to standing/sitting posture, fitness/ dance moves, and running mechanics without cues to demo IND with co-activation of deep core and lower  kinetich chain to minimzie overuse of pelvic floor tightness     Time  8    Period  Weeks    Status   New    Target Date  05/17/18      Additional Long Term Goals   Additional Long Term Goals  Yes      PT LONG TERM GOAL #6   Title  Pt will report no low back pain across 1 week in order to perform work tasks     Time  4    Period  Weeks    Status  New    Target Date  04/05/18      PT LONG TERM GOAL #7   Title  Pt will show proper pelvic floor lengthening and contraction without adductor overuse in order to eliminate urine without straining and to return to running     Time  2    Period  Weeks    Status  New    Target Date  03/22/18            Plan - 03/12/18 1501    Clinical Impression Statement  Pt showed good carry over with equal pelvic girdle alignment. demo'd decreased R posterior mm tightness, improved thorax lowering with exhalation, scapular depression, R pelvic floor tightness, and increased L glut strength post Tx. Pt was provided relaxation training. Pt reported feeling "calm". Her period-related pain decreased from 7/10 to 5/10.   Pt continues to benefit from skilled PT.      Rehab Potential  Good    PT Frequency  1x / week    PT Duration  12 weeks    PT Treatment/Interventions  Therapeutic activities;Therapeutic exercise;Balance training;Aquatic Therapy;Electrical Stimulation;Moist Heat;Neuromuscular re-education;Scar mobilization;Manual lymph drainage;Patient/family education;Stair training;Taping;Manual techniques;Functional mobility training    Consulted and Agree with Plan of Care  Patient       Patient will benefit from skilled therapeutic intervention in order to improve the following deficits and impairments:  Impaired sensation, Improper body mechanics, Pain, Increased muscle spasms, Decreased mobility, Decreased  coordination, Impaired UE functional use, Hypomobility, Decreased strength, Decreased range of motion, Decreased endurance, Decreased activity tolerance, Hypermobility, Postural dysfunction  Visit Diagnosis: Sacrococcygeal disorders, not elsewhere  classified  Other muscle spasm  Abnormal posture     Problem List Patient Active Problem List   Diagnosis Date Noted  . Vitamin D deficiency 12/18/2017  . Interstitial cystitis 08/04/2015  . Pelvic floor dysfunction 08/04/2015  . Dysmenorrhea 05/24/2015  . Preop testing 05/24/2015  . Vestibulitis, vulvar 05/24/2015  . Dysuria 11/26/2014  . Renal colic 07/03/2014  . WPW (Wolff-Parkinson-White syndrome) 03/18/2014  . Hip flexor tightness 01/20/2014  . 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 Nonallopathic lesion of lumbosacral region 01/20/2014  . Nonallopathic lesion of thoracic region 01/20/2014  . Nonallopathic lesion of cervical region 01/20/2014  . Dyspepsia 04/07/2013  . Tachycardia, unspecified 10/22/2012  . Constipation 10/10/2012  . History of anal fissures 10/10/2012  . Lactose intolerance 03/23/2011  . Vasovagal syncope 03/23/2011    Mariane Masters ,PT, DPT, E-RYT  03/12/2018, 3:04 PM  [place] [facility] MAIN [facility] SERVICES [address] [place], [place], [address] Phone: [phone]   Fax:  [phone]  Name: Katherine Schultz MRN: [id number] Date of Birth: Sep 01, 1995

## 2018-03-13 ENCOUNTER — Ambulatory Visit: Payer: BLUE CROSS/BLUE SHIELD | Admitting: Obstetrics and Gynecology

## 2018-03-13 ENCOUNTER — Encounter: Payer: Self-pay | Admitting: Obstetrics and Gynecology

## 2018-03-13 VITALS — BP 118/64 | HR 98 | Ht 66.0 in | Wt 149.6 lb

## 2018-03-13 DIAGNOSIS — N946 Dysmenorrhea, unspecified: Secondary | ICD-10-CM

## 2018-03-13 DIAGNOSIS — N921 Excessive and frequent menstruation with irregular cycle: Secondary | ICD-10-CM | POA: Diagnosis not present

## 2018-03-13 DIAGNOSIS — N3011 Interstitial cystitis (chronic) with hematuria: Secondary | ICD-10-CM

## 2018-03-13 DIAGNOSIS — E559 Vitamin D deficiency, unspecified: Secondary | ICD-10-CM

## 2018-03-13 MED ORDER — VITAMIN D (ERGOCALCIFEROL) 1.25 MG (50000 UNIT) PO CAPS: 50000.0000 [IU] | ORAL_CAPSULE | ORAL | 1 refills | Status: DC

## 2018-03-13 MED ORDER — NORETHINDRONE 0.35 MG PO TABS: 1.0000 | ORAL_TABLET | Freq: Every day | ORAL | 11 refills | Status: DC

## 2018-03-13 NOTE — Progress Notes (Signed)
  Subjective:     Patient ID: Katherine Schultz, female   DOB: 03-Jul-1995, 22 y.o.   MRN: [id number]  HPI Here to discuss hormones- started on LoSeasonique in August and menses have lessened but are still very painful. Reports lower pelvic pain and radiates into thighs. Feels like IC is worse to, has mood swings and feels really tired all the time.  She noticed light spotting around the time her period was supposed to be that is followed by daily brown spotting.  Review of Systems  Constitutional: Positive for fatigue.  Genitourinary: Positive for menstrual problem, pelvic pain and vaginal bleeding.  All other systems reviewed and are negative.      Objective:   Physical Exam A&Ox4 Well groomed female in no distress Blood pressure 118/64, pulse 98, height 5\' 6"  (1.676 m), weight 149 lb 9.6 oz (67.9 kg). Body mass index is 24.15 kg/m.  Pelvic not indicated    Assessment:     Dysmenorrhea BTB on OCPs Vit D deficiency IC    Plan:     Will switch to progestin only product, desires staying with pills for now. Will consider depo in future if pills do not work. Refill Vit D   RTC 1 month for Annual.   Abdulrahman Bracey,CNM

## 2018-03-13 NOTE — Patient Instructions (Signed)
Dysmenorrhea °Menstrual cramps (dysmenorrhea) are caused by the muscles of the uterus tightening (contracting) during a menstrual period. For some women, this discomfort is merely bothersome. For others, dysmenorrhea can be severe enough to interfere with everyday activities for a few days each month. °Primary dysmenorrhea is menstrual cramps that last a couple of days when you start having menstrual periods or soon after. This often begins after a teenager starts having her period. As a woman gets older or has a baby, the cramps will usually lessen or disappear. Secondary dysmenorrhea begins later in life, lasts longer, and the pain may be stronger than primary dysmenorrhea. The pain may start before the period and last a few days after the period. °What are the causes? °Dysmenorrhea is usually caused by an underlying problem, such as: °· The tissue lining the uterus grows outside of the uterus in other areas of the body (endometriosis). °· The endometrial tissue, which normally lines the uterus, is found in or grows into the muscular walls of the uterus (adenomyosis). °· The pelvic blood vessels are engorged with blood just before the menstrual period (pelvic congestive syndrome). °· Overgrowth of cells (polyps) in the lining of the uterus or cervix. °· Falling down of the uterus (prolapse) because of loose or stretched ligaments. °· Depression. °· Bladder problems, infection, or inflammation. °· Problems with the intestine, a tumor, or irritable bowel syndrome. °· Cancer of the female organs or bladder. °· A severely tipped uterus. °· A very tight opening or closed cervix. °· Noncancerous tumors of the uterus (fibroids). °· Pelvic inflammatory disease (PID). °· Pelvic scarring (adhesions) from a previous surgery. °· Ovarian cyst. °· An intrauterine device (IUD) used for birth control. °What increases the risk? °You may be at greater risk of dysmenorrhea if: °· You are younger than age 30. °· You started puberty  early. °· You have irregular or heavy bleeding. °· You have never given birth. °· You have a family history of this problem. °· You are a smoker. °What are the signs or symptoms? °· Cramping or throbbing pain in your lower abdomen. °· Headaches. °· Lower back pain. °· Nausea or vomiting. °· Diarrhea. °· Sweating or dizziness. °· Loose stools. °How is this diagnosed? °A diagnosis is based on your history, symptoms, physical exam, diagnostic tests, or procedures. Diagnostic tests or procedures may include: °· Blood tests. °· Ultrasonography. °· An examination of the lining of the uterus (dilation and curettage, D&C). °· An examination inside your abdomen or pelvis with a scope (laparoscopy). °· X-rays. °· CT scan. °· MRI. °· An examination inside the bladder with a scope (cystoscopy). °· An examination inside the intestine or stomach with a scope (colonoscopy, gastroscopy). °How is this treated? °Treatment depends on the cause of the dysmenorrhea. Treatment may include: °· Pain medicine prescribed by your health care provider. °· Birth control pills or an IUD with progesterone hormone in it. °· Hormone replacement therapy. °· Nonsteroidal anti-inflammatory drugs (NSAIDs). These may help stop the production of prostaglandins. °· Surgery to remove adhesions, endometriosis, ovarian cyst, or fibroids. °· Removal of the uterus (hysterectomy). °· Progesterone shots to stop the menstrual period. °· Cutting the nerves on the sacrum that go to the female organs (presacral neurectomy). °· Electric current to the sacral nerves (sacral nerve stimulation). °· Antidepressant medicine. °· Psychiatric therapy, counseling, or group therapy. °· Exercise and physical therapy. °· Meditation and yoga therapy. °· Acupuncture. °Follow these instructions at home: °· Only take over-the-counter or prescription medicines as directed   by your health care provider. °· Place a heating pad or hot water bottle on your lower back or abdomen. Do not  sleep with the heating pad. °· Use aerobic exercises, walking, swimming, biking, and other exercises to help lessen the cramping. °· Massage to the lower back or abdomen may help. °· Stop smoking. °· Avoid alcohol and caffeine. °Contact a health care provider if: °· Your pain does not get better with medicine. °· You have pain with sexual intercourse. °· Your pain increases and is not controlled with medicines. °· You have abnormal vaginal bleeding with your period. °· You develop nausea or vomiting with your period that is not controlled with medicine. °Get help right away if: °You pass out. °This information is not intended to replace advice given to you by your health care provider. Make sure you discuss any questions you have with your health care provider. °Document Released: 04/10/2005 Document Revised: 09/16/2015 Document Reviewed: 09/26/2012 °Elsevier Interactive Patient Education © 2017 [employer] ° °

## 2018-03-19 ENCOUNTER — Ambulatory Visit: Payer: BLUE CROSS/BLUE SHIELD | Admitting: Physical Therapy

## 2018-03-29 ENCOUNTER — Ambulatory Visit: Payer: BLUE CROSS/BLUE SHIELD | Attending: Urology | Admitting: Physical Therapy

## 2018-03-29 DIAGNOSIS — M545 Low back pain: Secondary | ICD-10-CM | POA: Insufficient documentation

## 2018-03-29 DIAGNOSIS — R293 Abnormal posture: Secondary | ICD-10-CM | POA: Insufficient documentation

## 2018-03-29 DIAGNOSIS — G8929 Other chronic pain: Secondary | ICD-10-CM | POA: Diagnosis not present

## 2018-03-29 DIAGNOSIS — M62838 Other muscle spasm: Secondary | ICD-10-CM | POA: Insufficient documentation

## 2018-03-29 DIAGNOSIS — M533 Sacrococcygeal disorders, not elsewhere classified: Secondary | ICD-10-CM | POA: Insufficient documentation

## 2018-03-29 NOTE — Patient Instructions (Signed)
Education on physiology of pelvic floor in functional activities  ______  Practice proper pelvic floor coordination with deep core level 1 and 2    Inhale: expand pelvic floor muscles Exhale" "j" scoop, allow pelvic floor to close, lift first before belly sinks   ( not "draw abdominal muscle to spine" or strain with abdominal muscles")   __________  Do not over squeeze shoulders back in standing posture,  feel shoulder blades glide down with exhale

## 2018-03-29 NOTE — Therapy (Signed)
[place] [facility] MAIN [facility] SERVICES [address] [place], [place], [address] Phone: [phone]   Fax:  [phone]  Physical Therapy Treatment  Patient Details  Name: Katherine Schultz MRN: [id number] Date of Birth: [birth year] Referring Provider (PT): MacDiarmid    Encounter Date: 03/29/2018  PT End of Session - 03/29/18 0856    Visit Number  3    Number of Visits  12    Date for PT Re-Evaluation  05/31/18    PT Start Time  0813    PT Stop Time  0900    PT Time Calculation (min)  47 min    Activity Tolerance  Patient tolerated treatment well    Behavior During Therapy  [facility] for tasks assessed/performed       Past Medical History:  Diagnosis Date  . Arrhythmia    Tachycardia  . GERD (gastroesophageal reflux disease)   . IC (interstitial cystitis)   . Wolff-Parkinson-White (WPW) syndrome     Past Surgical History:  Procedure Laterality Date  . CYSTOSCOPY  2017  . WISDOM TOOTH EXTRACTION  2012    There were no vitals filed for this visit.  Subjective Assessment - 03/29/18 0817    Subjective  Pt reports noticing her thoughts and catching herself more in the moment rather than thinking 10 steps ahead and feeling her body relax immediately. Pt  corrects her posture when she notices low back pain by unlocking her knees and shifting and "it has been amazing" and helped alot with decreasing LBP.  Pt also also has stopped taking a medication with estrogen wich caused her lot of pain for 3 months. Her MD is taken her off of it and she has felt less pain since. Her MD told her that estrogen has a sideeffect on patients with IC by flaring up IC.       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  Pt incorporates stretches prior to her classes not after.   Works as a Production designer, theatre/television/film with some standing and sitting.           [facility] PT Assessment - 03/29/18 0854      Observation/Other Assessments   Observations  no cues for upright seated posture                 Pelvic Floor Special Questions - 03/29/18 0853    Pelvic Floor Internal Exam  pt consented verbally without contraindications     Exam Type  Vaginal    Palpation  increased tightness at obt int (tenderness) on R  ( decreased post Tx)     Strength  strong squeeze, against strong resistance        [facility] Adult PT Treatment/Exercise - 03/29/18 0855      Neuro Re-ed    Neuro Re-ed Details   see pt instructions       Manual Therapy   Internal Pelvic Floor  STM/MWM with coordinated breathing to release obt int R                   PT Long Term Goals - 03/08/18 1622      PT LONG TERM GOAL #1   Title  Pt will decrease her ZUNG anixety score form % to <% in order to demo improved IND with relaxation practices to minimize Sx    Time  12    Period  Weeks    Status  New  Target Date  05/31/18      PT LONG TERM GOAL #2   Title  Pt will decrease PSQI score from 71% to < 35% inorder to improve sleep quality and wellness    Time  10    Period  Weeks    Status  New    Target Date  05/31/18      PT LONG TERM GOAL #3   Title  Pt will decrease NIH CPSI 49 % to < 25% in order to improve urination and pain     Time  12    Period  Weeks    Status  New    Target Date  05/31/18      PT LONG TERM GOAL #4   Title  Pt will demo decrease hypomobility of thoracic segments, increase SIJ mobility, decrease R pelvic floor mm tightness in order to progress towards deep core level 2     Time  2    Period  Weeks    Status  New    Target Date  03/22/18      PT LONG TERM GOAL #5   Title  Pt will demo proper alignment/ modifications to standing/sitting posture, fitness/ dance moves, and running mechanics without cues to demo IND with co-activation of deep core and lower  kinetich chain to minimzie overuse of pelvic floor tightness     Time  8    Period  Weeks    Status  New    Target Date  05/17/18       Additional Long Term Goals   Additional Long Term Goals  Yes      PT LONG TERM GOAL #6   Title  Pt will report no low back pain across 1 week in order to perform work tasks     Time  4    Period  Weeks    Status  New    Target Date  04/05/18      PT LONG TERM GOAL #7   Title  Pt will show proper pelvic floor lengthening and contraction without adductor overuse in order to eliminate urine without straining and to return to running     Time  2    Period  Weeks    Status  New    Target Date  03/22/18            Plan - 03/29/18 0857    Clinical Impression Statement  Pt demo'd decreased mm tensions at R obturator internus mm tightness post intravaginal assessment. Pt demo'd good carry voer with upright posture.  Required minor cues for less abdominal overuse with pelvic floor movement. Pt continues to benefit from skilled PT. Plan to address pylometrics and asses R leg strength. Pt reported R knee injury in the past. Regional interdependent approach is warranted to yield better outcomes.     Rehab Potential  Good    PT Frequency  1x / week    PT Duration  12 weeks    PT Treatment/Interventions  Therapeutic activities;Therapeutic exercise;Balance training;Aquatic Therapy;Electrical Stimulation;Moist Heat;Neuromuscular re-education;Scar mobilization;Manual lymph drainage;Patient/family education;Stair training;Taping;Manual techniques;Functional mobility training    Consulted and Agree with Plan of Care  Patient       Patient will benefit from skilled therapeutic intervention in order to improve the following deficits and impairments:  Impaired sensation, Improper body mechanics, Pain, Increased muscle spasms, Decreased mobility, Decreased coordination, Impaired UE functional use, Hypomobility, Decreased strength, Decreased range of motion, Decreased endurance, Decreased activity tolerance, Hypermobility, Postural  dysfunction  Visit Diagnosis: Sacrococcygeal disorders, not elsewhere  classified  Other muscle spasm  Abnormal posture     Problem List Patient Active Problem List   Diagnosis Date Noted  . Vitamin D deficiency 12/18/2017  . Interstitial cystitis 08/04/2015  . Pelvic floor dysfunction 08/04/2015  . Dysmenorrhea 05/24/2015  . Preop testing 05/24/2015  . Vestibulitis, vulvar 05/24/2015  . Dysuria 11/26/2014  . Renal colic 07/03/2014  . WPW (Wolff-Parkinson-White syndrome) 03/18/2014  . Hip flexor tightness 01/20/2014  . 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 Nonallopathic lesion of lumbosacral region 01/20/2014  . Nonallopathic lesion of thoracic region 01/20/2014  . Nonallopathic lesion of cervical region 01/20/2014  . Dyspepsia 04/07/2013  . Tachycardia, unspecified 10/22/2012  . Constipation 10/10/2012  . History of anal fissures 10/10/2012  . Lactose intolerance 03/23/2011  . Vasovagal syncope 03/23/2011    Mariane Masters ,PT, DPT, E-RYT  03/29/2018, 9:00 AM  [place] [facility] MAIN [facility] SERVICES [address] [place], [place], [address] Phone: [phone]   Fax:  [phone]  Name: Katherine Schultz MRN: [id number] Date of Birth: Jun 28, 1995

## 2018-04-05 ENCOUNTER — Ambulatory Visit: Payer: BLUE CROSS/BLUE SHIELD | Admitting: Physical Therapy

## 2018-04-05 DIAGNOSIS — M533 Sacrococcygeal disorders, not elsewhere classified: Secondary | ICD-10-CM

## 2018-04-05 DIAGNOSIS — R293 Abnormal posture: Secondary | ICD-10-CM | POA: Diagnosis not present

## 2018-04-05 DIAGNOSIS — G8929 Other chronic pain: Secondary | ICD-10-CM | POA: Diagnosis not present

## 2018-04-05 DIAGNOSIS — M62838 Other muscle spasm: Secondary | ICD-10-CM | POA: Diagnosis not present

## 2018-04-05 DIAGNOSIS — M545 Low back pain: Secondary | ICD-10-CM | POA: Diagnosis not present

## 2018-04-05 NOTE — Patient Instructions (Signed)
POST DANCE AND EXERCISE OR STANDING FOR LONG PERIODS self care  Feet care :  Self -feet massage   Handshake : fingers between toes, moving ballmounds/toes back and forth several times while other hand anchors at arch. Do the same at the hind/mid foot.  Heel to toes upward to a letter Big Letter T strokes to spread ballmounds and toes, several times, pinch between webs of toes  Run finger tips along top of foot between long bones "comb between the bones"    Wiggle toes and spread them out when relaxing   ________  Dorsiflexion stretches  Sliding heel back  Seat and    Standing in lunges, heel down , knee bends in back leg  10 reps

## 2018-04-05 NOTE — Therapy (Addendum)
[place] [facility] [address] [place], [place], [address] Phone: [phone]   Fax:  [phone]  Physical Therapy Treatment  Patient Details  Name: Katherine ExonKaitlyn Willis MRN: [id number] Date of Birth: 01-26-1996 Referring Provider (PT): MacDiarmid    Encounter Date: 04/05/2018    Past Medical History:  Diagnosis Date  . Arrhythmia    Tachycardia  . GERD (gastroesophageal reflux disease)   . IC (interstitial cystitis)   . Wolff-Parkinson-White (WPW) syndrome     Past Surgical History:  Procedure Laterality Date  . CYSTOSCOPY  2017  . WISDOM TOOTH EXTRACTION  2012    There were no vitals filed for this visit.        Subjective Assessment - 04/05/18 0815    Subjective  Pt reports she has noticed when she connect the two between stress and pelvic floor, and practicing he breathing has helped so much. Pt was able to perform a 2 hours dance performance and notice the moment when she noticed pelvic pain, pt was able to release the tensions with breathing and body awareness when landing.  Before in the past,it took 2 horus to get out of the pain after dancing. 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  Pt loves dancing and now she feels she is able to go into performances with more confidence and not have to prepare herself mentally to feel pain.      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  Pt incorporates stretches prior to her classes not after.   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           [facility] - 04/05/18 0901      Palpation   Palpation comment  midfoot joint hypomobilty, interreosus/ adductor hallucis transverse head B  mm tight,                 Pelvic Floor Special Questions - 04/05/18 0900    Pelvic Floor Internal Exam  pt consented verbally without contraindications     Exam Type  Vaginal    Palpation  no tightness at all layers of pelvic floor B     Strength  strong squeeze, against strong resistance        [facility] Adult PT Treatment/Exercise - 04/05/18 0901      Therapeutic Activites    Therapeutic Activities  --   see pt instructions      Manual Therapy   Manual therapy comments  mobilization at midfoot jont, STM at mm noted                   PT Long Term Goals - 04/05/18 0906      PT LONG TERM GOAL #1   Title  Pt will decrease her ZUNG anixety score form % to <% in order to demo improved IND with relaxation practices to minimize Sx    Time  12    Period  Weeks    Status  On-going      PT LONG TERM GOAL #2   Title  Pt will decrease PSQI score from 71% to < 35% inorder to improve sleep quality and wellness    Time  10    Period  Weeks    Status  On-going      PT LONG TERM GOAL #3   Title  Pt will  decrease NIH CPSI 49 % to < 25% in order to improve urination and pain     Time  12    Period  Weeks    Status  On-going      PT LONG TERM GOAL #4   Title  Pt will demo decrease hypomobility of thoracic segments, increase SIJ mobility, decrease R pelvic floor mm tightness in order to progress towards deep core level 2     Time  2    Period  Weeks    Status  On-going      PT LONG TERM GOAL #5   Title  Pt will demo proper alignment/ modifications to standing/sitting posture, fitness/ dance moves, and running mechanics without cues to demo IND with co-activation of deep core and lower  kinetich chain to minimzie overuse of pelvic floor tightness     Time  8    Period  Weeks    Status  On-going      PT LONG TERM GOAL #6   Title  Pt will report no low back pain across 1 week in order to perform work tasks     Time  4    Period  Weeks    Status  On-going      PT LONG TERM GOAL #7   Title  Pt will show proper pelvic floor lengthening and contraction without adductor overuse in order to eliminate urine without straining and to return to running     Time   2    Period  Weeks    Status  On-going                                Plan - 06/07/18 1191    Clinical Impression Statement  Pt demo'd no tightness of all 3 layers of pelvic floor today which indicates good progress. Pt was able to demo immediate lengthening of pelvic floor mm without use of ab muscles. Pt continues to benefit from skilled PT.    Rehab Potential  Good    PT Frequency  1x / week    PT Duration  12 weeks    PT Treatment/Interventions  Therapeutic activities;Therapeutic exercise;Balance training;Aquatic Therapy;Electrical Stimulation;Moist Heat;Neuromuscular re-education;Scar mobilization;Manual lymph drainage;Patient/family education;Stair training;Taping;Manual techniques;Functional mobility training    Consulted and Agree with Plan of Care  Patient       Patient will benefit from skilled therapeutic intervention in order to improve the following deficits and impairments:  Impaired sensation, Improper body mechanics, Pain, Increased muscle spasms, Decreased mobility, Decreased coordination, Impaired UE functional use, Hypomobility, Decreased strength, Decreased range of motion, Decreased endurance, Decreased activity tolerance, Hypermobility, Postural dysfunction  Visit Diagnosis: Sacrococcygeal disorders, not elsewhere classified  Other muscle spasm  Abnormal posture     Problem List Patient Active Problem List   Diagnosis Date Noted  . Nonallopathic lesion of sacral region 04/22/2018  . Vitamin D deficiency 12/18/2017  . Interstitial cystitis 08/04/2015  . Pelvic floor dysfunction 08/04/2015  . Dysmenorrhea 05/24/2015  . Preop testing 05/24/2015  . Vestibulitis, vulvar 05/24/2015  . Dysuria 11/26/2014  . Renal colic 07/03/2014  . WPW (Wolff-Parkinson-White syndrome) 03/18/2014  . Hip flexor tightness 01/20/2014  . 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 Nonallopathic lesion of lumbosacral region 01/20/2014  . Nonallopathic lesion of  thoracic region 01/20/2014  . Nonallopathic lesion of cervical region 01/20/2014  . Dyspepsia 04/07/2013  . Tachycardia, unspecified 10/22/2012  . Constipation 10/10/2012  .  History of anal fissures 10/10/2012  . Lactose intolerance 03/23/2011  . Vasovagal syncope 03/23/2011    Mariane Masters ,PT, DPT, E-RYT  06/07/2018, 9:38 AM  [place] [facility] MAIN [facility] SERVICES [address] [place], [place], [address] Phone: [phone]   Fax:  [phone]  Name: Tomara Youngberg MRN: [id number] Date of Birth: [birth year]

## 2018-04-11 ENCOUNTER — Encounter: Payer: BLUE CROSS/BLUE SHIELD | Admitting: Obstetrics and Gynecology

## 2018-04-12 ENCOUNTER — Ambulatory Visit: Payer: BLUE CROSS/BLUE SHIELD | Admitting: Physical Therapy

## 2018-04-12 DIAGNOSIS — M62838 Other muscle spasm: Secondary | ICD-10-CM | POA: Diagnosis not present

## 2018-04-12 DIAGNOSIS — R293 Abnormal posture: Secondary | ICD-10-CM | POA: Diagnosis not present

## 2018-04-12 DIAGNOSIS — M533 Sacrococcygeal disorders, not elsewhere classified: Secondary | ICD-10-CM

## 2018-04-12 DIAGNOSIS — G8929 Other chronic pain: Secondary | ICD-10-CM | POA: Diagnosis not present

## 2018-04-12 DIAGNOSIS — M545 Low back pain: Secondary | ICD-10-CM | POA: Diagnosis not present

## 2018-04-12 NOTE — Therapy (Signed)
[place] [facility] [address] [place], [place], [address] Phone: [phone]   Fax:  [phone]  Physical Therapy Treatment  Patient Details  Name: Katherine ExonKaitlyn Schultz MRN: [id number] Date of Birth: 09/01/1995 Referring Provider (PT): MacDiarmid    Encounter Date: 04/12/2018  PT End of Session - 04/12/18 0920    Visit Number  5    Number of Visits  12    Date for PT Re-Evaluation  05/31/18    PT Start Time  0810    PT Stop Time  0915    PT Time Calculation (min)  65 min    Activity Tolerance  Patient tolerated treatment well    Behavior During Therapy  [facility] for tasks assessed/performed       Past Medical History:  Diagnosis Date  . Arrhythmia    Tachycardia  . GERD (gastroesophageal reflux disease)   . IC (interstitial cystitis)   . Wolff-Parkinson-White (WPW) syndrome     Past Surgical History:  Procedure Laterality Date  . CYSTOSCOPY  2017  . WISDOM TOOTH EXTRACTION  2012    There were no vitals filed for this visit.  Subjective Assessment - 04/12/18 0819    Subjective  Pt reports her low back pain normally is 6/10 with long periods standing and lately she has been performing with dance.  The low back pain goes up to 8-10/10 with menstrual cycles. Pt reports feels a dull achey pain for a few days 3-7 weeks with back in lower pain  prior to menstrual pain and then all of a sudden , there is a feeling like "someone is scrapping inside my uterus" and she can not breathe because it is so bad. She can not move. The pain shoots down her leg and makes it hard to walk. Nothing eases it. The last time pt had this type of pain episode was in August.  For teh next 3 months. pt was a birth contol pll for this pain but while on this pill, pt felt the dull achey pain consistently for the 3 months with bloody discharge almost every day. It never escalated to the regular period pains when it got really really bad. It would heighten some  days but to that extent. Pt returned to her GYN MD and was told that this type of medicaton with the estrogen can cause IC flares in pts with IC.  Pt decided to get off this medication 3-4 weeks ago. Yesterday, pt started to notice the menstrual pain which was the dull ache. No bleeding yet.       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  Pt incorporates stretches prior to her classes not after.   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           [facility] Assessment - 04/12/18 0855      Coordination   Gross Motor Movements are Fluid and Coordinated  --   B gluts delayed co-activation w/ hamstring, multifidis     Strength   Overall Strength Comments  posterior sling ( Diane Lee's test for thoracolumbar strength) w/ lumbopelvivc perturbation , pt reported pain with lying on her belly . Pt reported less pain after changing position following test        Palpation   SI assessment   levelled PSIS, R QL more mm bulk > L,  [facility] Adult PT Treatment/Exercise - 04/12/18 0905      Therapeutic Activites    Therapeutic Activities  --   see pt instructions      Neuro Re-ed    Neuro Re-ed Details   see pt instructions                   PT Long Term Goals - 04/05/18 0906      PT LONG TERM GOAL #1   Title  Pt will decrease her ZUNG anixety score form % to <% in order to demo improved IND with relaxation practices to minimize Sx    Time  12    Period  Weeks    Status  On-going      PT LONG TERM GOAL #2   Title  Pt will decrease PSQI score from 71% to < 35% inorder to improve sleep quality and wellness    Time  10    Period  Weeks    Status  On-going      PT LONG TERM GOAL #3   Title  Pt will decrease NIH CPSI 49 % to < 25% in order to improve urination and pain     Time  12    Period  Weeks    Status  On-going      PT LONG TERM GOAL #4   Title  Pt will demo decrease  hypomobility of thoracic segments, increase SIJ mobility, decrease R pelvic floor mm tightness in order to progress towards deep core level 2     Time  2    Period  Weeks    Status  On-going      PT LONG TERM GOAL #5   Title  Pt will demo proper alignment/ modifications to standing/sitting posture, fitness/ dance moves, and running mechanics without cues to demo IND with co-activation of deep core and lower  kinetich chain to minimzie overuse of pelvic floor tightness     Time  8    Period  Weeks    Status  On-going      PT LONG TERM GOAL #6   Title  Pt will report no low back pain across 1 week in order to perform work tasks     Time  4    Period  Weeks    Status  On-going      PT LONG TERM GOAL #7   Title  Pt will show proper pelvic floor lengthening and contraction without adductor overuse in order to eliminate urine without straining and to return to running     Time  2    Period  Weeks    Status  On-going            Plan - 04/12/18 0920    Clinical Impression Statement Focused today  on pt's menstrual related LBP and pelvic pain. Provided education about tracking menstrual cycle and how to use restorative yoga and positioning as a relaxation practice to stretch abdominopelvic area when feeling menstrual cramps. Pt reported the restorative yoga position to to be the "most relaxing she has ever been in because of how supported her body felt . She noticed that in other positions on the bed, she has felt tensions and lack of ability to fully relax."  Assessment showed poor co-activation with gluts/ multifidis, hamstrings and continued need for deep core strengthening to minimize her LBP. Plan to add in posterior chain strengthening at next session.  Pt continues to benefit from skilled PT  Rehab Potential  Good    PT Frequency  1x / week    PT Duration  12 weeks    PT Treatment/Interventions  Therapeutic activities;Therapeutic exercise;Balance training;Aquatic  Therapy;Electrical Stimulation;Moist Heat;Neuromuscular re-education;Scar mobilization;Manual lymph drainage;Patient/family education;Stair training;Taping;Manual techniques;Functional mobility training    Consulted and Agree with Plan of Care  Patient       Patient will benefit from skilled therapeutic intervention in order to improve the following deficits and impairments:  Impaired sensation, Improper body mechanics, Pain, Increased muscle spasms, Decreased mobility, Decreased coordination, Impaired UE functional use, Hypomobility, Decreased strength, Decreased range of motion, Decreased endurance, Decreased activity tolerance, Hypermobility, Postural dysfunction  Visit Diagnosis: Sacrococcygeal disorders, not elsewhere classified  Other muscle spasm  Abnormal posture     Problem List Patient Active Problem List   Diagnosis Date Noted  . Vitamin D deficiency 12/18/2017  . Interstitial cystitis 08/04/2015  . Pelvic floor dysfunction 08/04/2015  . Dysmenorrhea 05/24/2015  . Preop testing 05/24/2015  . Vestibulitis, vulvar 05/24/2015  . Dysuria 11/26/2014  . Renal colic 07/03/2014  . WPW (Wolff-Parkinson-White syndrome) 03/18/2014  . Hip flexor tightness 01/20/2014  . 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 Nonallopathic lesion of lumbosacral region 01/20/2014  . Nonallopathic lesion of thoracic region 01/20/2014  . Nonallopathic lesion of cervical region 01/20/2014  . Dyspepsia 04/07/2013  . Tachycardia, unspecified 10/22/2012  . Constipation 10/10/2012  . History of anal fissures 10/10/2012  . Lactose intolerance 03/23/2011  . Vasovagal syncope 03/23/2011    Mariane Masters ,PT, DPT, E-RYT  04/12/2018, 3:37 PM  [place] [facility] MAIN [facility] SERVICES [address] [place], [place], [address] Phone: [phone]   Fax:  [phone]  Name: Val Farnam MRN: [id number] Date of Birth: 1996/04/21

## 2018-04-12 NOTE — Patient Instructions (Addendum)
2 blankets https://www.yogaaccessories.com/traditional-mexican-yoga-blanket.html 2 blocks https://www.yogaaccessories.com/3-inch-foam-yoga-block.html Ideas for poses Restorative pose library  http://givingtreewellness.net/wellness-library/restorative-yoga-practice/restorative-yoga-practice-poses-for-anxiety/   https://www.ebay.com/p/Relax-and-Renew-Restful-Yoga-for-Stressful-Times-by-Judith-Lasater-1995-Paperback/1048661?ZOX=096045409811iid=192941481918     Principles of restorative yoga for relaxation during cycle:  Lower body twist with bolster on incline  towel over yes to block out light  Relax the support beneath you  Settle in   10-15 min per pose and end of corpse pose ( spine is neutral, small neck roll and pillow under knees)

## 2018-04-19 ENCOUNTER — Ambulatory Visit: Payer: BLUE CROSS/BLUE SHIELD | Admitting: Physical Therapy

## 2018-04-21 NOTE — Progress Notes (Signed)
Tawana ScaleZach Sharonna Vinje D.O. [facility] [address] [place], [place] [address] Phone: [phone] Subjective:    I Katherine NighKana Thompson am serving as a Neurosurgeonscribe for Dr. Antoine PrimasZachary Saralyn Willison.   I'm seeing this patient by the request  of:  Shambley, Melody N, CNM   CC: Back pain  BJY:[id number]:Subjective  Gertie ExonKaitlyn Willis is a 22 y.o. female coming in with complaint of back and neck pain. States that her neck locks up and shoots pain to the anterior skull. One of her ribs on the right side is "out of line". Lower back pain as well. No numbness and tingling. States that bilaterally she feels a catch in her glut. Just got back into dance. Neck feels tight. Wakes up every day with a sore neck.   Onset- Chronic Location- bilateral Aggravating factors- back pain is worse with dancing and sitting      Past Medical History:  Diagnosis Date  . Arrhythmia    Tachycardia  . GERD (gastroesophageal reflux disease)   . IC (interstitial cystitis)   . Wolff-Parkinson-White (WPW) syndrome    Past Surgical History:  Procedure Laterality Date  . CYSTOSCOPY  2017  . WISDOM TOOTH EXTRACTION  2012   Social History   Socioeconomic History  . Marital status: Single    Spouse name: Not on file  . Number of children: Not on file  . Years of education: Not on file  . Highest education level: Not on file  Occupational History  . Not on file  Social Needs  . Financial resource strain: Not on file  . Food insecurity:    Worry: Not on file    Inability: Not on file  . Transportation needs:    Medical: Not on file    Non-medical: Not on file  Tobacco Use  . Smoking status: Never Smoker  . Smokeless tobacco: Never Used  Substance and Sexual Activity  . Alcohol use: No  . Drug use: No  . Sexual activity: Not Currently    Birth control/protection: Pill  Lifestyle  . Physical activity:    Days per week: Not on file    Minutes per session: Not on file  . Stress: Not on file  Relationships  . Social connections:     Talks on phone: Not on file    Gets together: Not on file    Attends religious service: Not on file    Active member of club or organization: Not on file    Attends meetings of clubs or organizations: Not on file    Relationship status: Not on file  Other Topics Concern  . Not on file  Social History Narrative  . Not on file   No Known Allergies History reviewed. No pertinent family history.  Current Outpatient Medications (Endocrine & Metabolic):  .  norethindrone (MICRONOR,CAMILA,ERRIN) 0.35 MG tablet, Take 1 tablet (0.35 mg total) by mouth daily.    Current Outpatient Medications (Analgesics):  .  naproxen (NAPROSYN) 500 MG tablet, Take 1 tablet (500 mg total) by mouth 2 (two) times daily with a meal. As needed for pain   Current Outpatient Medications (Other):  Marland Kitchen.  Vitamin D, Ergocalciferol, (DRISDOL) 1.25 MG (50000 UT) CAPS capsule, Take 1 capsule (50,000 Units total) by mouth every 7 (seven) days.    Past medical history, social, surgical and family history all reviewed in electronic medical record.  No pertanent information unless stated regarding to the chief complaint.   Review of Systems:  No headache, visual changes, nausea,  vomiting, diarrhea, constipation, dizziness, abdominal pain, skin rash, fevers, chills, night sweats, weight loss, swollen lymph nodes,chest pain, shortness of breath, mood changes.  Positive muscle aches, body aches  Objective  Blood pressure 106/78, pulse 99, height 5\' 6"  (1.676 m), weight 150 lb (68 kg), SpO2 98 %.   General: No apparent distress alert and oriented x3 mood and affect normal, dressed appropriately.  HEENT: Pupils equal, extraocular movements intact  Respiratory: Patient's speak in full sentences and does not appear short of breath  Cardiovascular: No lower extremity edema, non tender, no erythema  Skin: Warm dry intact with no signs of infection or rash on extremities or on axial skeleton.  Abdomen: Soft nontender  Neuro:  Cranial nerves II through XII are intact, neurovascularly intact in all extremities with 2+ DTRs and 2+ pulses.  Lymph: No lymphadenopathy of posterior or anterior cervical chain or axillae bilaterally.  Gait normal with good balance and coordination.  MSK:  Non tender with full range of motion and good stability and symmetric strength and tone of shoulders, elbows, wrist, hip, knee and ankles bilaterally.  Neck: Inspection mild loss of lordosis. No palpable stepoffs. Negative Spurling's maneuver. Patient does have some mild limitation in range of motion lacking the last 5 degrees of extension. Grip strength and sensation normal in bilateral hands Strength good C4 to T1 distribution No sensory change to C4 to T1 Negative Hoffman sign bilaterally Reflexes normal  Back Exam:  Inspection: Mild loss of lordosis Motion: Flexion 40 deg, Extension 40 deg, Side Bending to 35 deg bilaterally,  Rotation to 35 deg bilaterally  SLR laying: Negative  XSLR laying: Negative  Palpable tenderness: Tender to palpation in paraspinal musculature lumbar spine right greater than left. FABER: negative. Sensory change: Gross sensation intact to all lumbar and sacral dermatomes.  Reflexes: 2+ at both patellar tendons, 2+ at achilles tendons, Babinski's downgoing.  Strength at foot  Plantar-flexion: 5/5 Dorsi-flexion: 5/5 Eversion: 5/5 Inversion: 5/5  Leg strength  Quad: 5/5 Hamstring: 5/5 Hip flexor: 5/5 Hip abductors: 5/5  Gait unremarkable.  Osteopathic findings Cervical C2 flexed rotated and side bent right C4 flexed rotated and side bent left C6 flexed rotated and side bent left T3 extended rotated and side bent right inhaled third rib T9 extended rotated and side bent left L2 flexed rotated and side bent right Sacrum right on right    Impression and Recommendations:     This case required medical decision making of moderate complexity. The above documentation has been reviewed and is  accurate and complete Judi SaaZachary M Brityn Mastrogiovanni, DO       Note: This dictation was prepared with Dragon dictation along with smaller phrase technology. Any transcriptional errors that result from this process are unintentional.

## 2018-04-22 ENCOUNTER — Ambulatory Visit: Payer: BLUE CROSS/BLUE SHIELD | Admitting: Family Medicine

## 2018-04-22 ENCOUNTER — Encounter: Payer: Self-pay | Admitting: Family Medicine

## 2018-04-22 VITALS — BP 106/78 | HR 99 | Ht 66.0 in | Wt 150.0 lb

## 2018-04-22 DIAGNOSIS — M9981 Other biomechanical lesions of cervical region: Secondary | ICD-10-CM

## 2018-04-22 DIAGNOSIS — M6289 Other specified disorders of muscle: Secondary | ICD-10-CM | POA: Diagnosis not present

## 2018-04-22 DIAGNOSIS — M999 Biomechanical lesion, unspecified: Secondary | ICD-10-CM | POA: Diagnosis not present

## 2018-04-22 NOTE — Patient Instructions (Signed)
Great to see you  Ice 20 minutes 2 times daily. Usually after activity and before bed. Vitamin D 2000 IU daily  Iron 65mg  with 500mg  of vitamin C daily  Calcium pyruvate 1500mg  daily should do great! Exercises 3 times a week.  OK to hit the gym but do more machine weights See me again in 3-6 weeks Happy New Year!

## 2018-04-22 NOTE — Assessment & Plan Note (Signed)
Decision today to treat with OMT was based on Physical Exam  After verbal consent patient was treated with HVLA, ME, FPR techniques in cervical, thoracic, lumbar and sacral areas  Patient tolerated the procedure well with improvement in symptoms  Patient given exercises, stretches and lifestyle modifications  See medications in patient instructions if given  Patient will follow up in 4-8 weeks 

## 2018-04-22 NOTE — Assessment & Plan Note (Signed)
Patient does have some pelvic floor dysfunction.  I do believe that some of that is secondary to more the sacroiliac dysfunction.  Discussed with patient about home exercise, icing regimen, which activities to do which wants to avoid.  Discussed hip abductor strengthening.  Follow-up again in 4 to 8 weeks.

## 2018-04-25 ENCOUNTER — Ambulatory Visit: Payer: BLUE CROSS/BLUE SHIELD | Attending: Urology | Admitting: Physical Therapy

## 2018-05-01 ENCOUNTER — Encounter: Payer: BLUE CROSS/BLUE SHIELD | Admitting: Physical Therapy

## 2018-05-08 ENCOUNTER — Encounter: Payer: BLUE CROSS/BLUE SHIELD | Admitting: Physical Therapy

## 2018-05-21 NOTE — Progress Notes (Signed)
[name] [address] [place], [place] [address] Phone: [phone] Subjective:   Bruce Donath, am serving as a scribe for Dr. Antoine Primas.   CC: hip and back pain follow up   RCV:[id number]  Katherine Schultz is a 23 y.o. female coming in with complaint of neck and back pain. She said that she wakes up each night with tingling in her arms and neck pain. States that she is still using same pillow and bed. Denies any radiating symptoms during the day. Pain in lower back has been improving. Is aware of her posture. Pateint also mentions jaw locking with yawning.      Past Medical History:  Diagnosis Date  . Arrhythmia    Tachycardia  . GERD (gastroesophageal reflux disease)   . IC (interstitial cystitis)   . Wolff-Parkinson-White (WPW) syndrome    Past Surgical History:  Procedure Laterality Date  . CYSTOSCOPY  2017  . WISDOM TOOTH EXTRACTION  2012   Social History   Socioeconomic History  . Marital status: Single    Spouse name: Not on file  . Number of children: Not on file  . Years of education: Not on file  . Highest education level: Not on file  Occupational History  . Not on file  Social Needs  . Financial resource strain: Not on file  . Food insecurity:    Worry: Not on file    Inability: Not on file  . Transportation needs:    Medical: Not on file    Non-medical: Not on file  Tobacco Use  . Smoking status: Never Smoker  . Smokeless tobacco: Never Used  Substance and Sexual Activity  . Alcohol use: No  . Drug use: No  . Sexual activity: Not Currently    Birth control/protection: Pill  Lifestyle  . Physical activity:    Days per week: Not on file    Minutes per session: Not on file  . Stress: Not on file  Relationships  . Social connections:    Talks on phone: Not on file    Gets together: Not on file    Attends religious service: Not on file    Active member of club or organization: Not on file    Attends  meetings of clubs or organizations: Not on file    Relationship status: Not on file  Other Topics Concern  . Not on file  Social History Narrative  . Not on file   No Known Allergies No family history on file.  Current Outpatient Medications (Endocrine & Metabolic):  .  norethindrone (MICRONOR,CAMILA,ERRIN) 0.35 MG tablet, Take 1 tablet (0.35 mg total) by mouth daily.    Current Outpatient Medications (Analgesics):  .  naproxen (NAPROSYN) 500 MG tablet, Take 1 tablet (500 mg total) by mouth 2 (two) times daily with a meal. As needed for pain   Current Outpatient Medications (Other):  Marland Kitchen  Vitamin D, Ergocalciferol, (DRISDOL) 1.25 MG (50000 UT) CAPS capsule, Take 1 capsule (50,000 Units total) by mouth every 7 (seven) days.    Past medical history, social, surgical and family history all reviewed in electronic medical record.  No pertanent information unless stated regarding to the chief complaint.   Review of Systems:  No headache, visual changes, nausea, vomiting, diarrhea, constipation, dizziness, abdominal pain, skin rash, fevers, chills, night sweats, weight loss, swollen lymph nodes,, chest pain, shortness of breath, mood changes.  Positive muscle aches, body aches  Objective  Blood pressure 100/68, pulse Marland Kitchen)  103, height 5\' 6"  (1.676 m), weight 149 lb (67.6 kg), SpO2 95 %.  General: No apparent distress alert and oriented x3 mood and affect normal, dressed appropriately.  HEENT: Pupils equal, extraocular movements intact  Respiratory: Patient's speak in full sentences and does not appear short of breath  Cardiovascular: No lower extremity edema, non tender, no erythema  Skin: Warm dry intact with no signs of infection or rash on extremities or on axial skeleton.  Abdomen: Soft tender to palpation in the epigastric region Neuro: Cranial nerves II through XII are intact, neurovascularly intact in all extremities with 2+ DTRs and 2+ pulses.  Lymph: No lymphadenopathy of  posterior or anterior cervical chain or axillae bilaterally.  Gait normal with good balance and coordination.  MSK:  Non tender with full range of motion and good stability and symmetric strength and tone of shoulders, elbows, wrist, hip, knee and ankles bilaterally.  Mild hypermobility of multiple other joints Neck: Inspection loss of lordosis. No palpable stepoffs. Negative Spurling's maneuver. Range of motion in all planes of 5 to 10 degrees. Grip strength and sensation normal in bilateral hands Strength good C4 to T1 distribution No sensory change to C4 to T1 Negative Hoffman sign bilaterally Reflexes normal Tightness of the trapezius   Back Exam:  Inspection: Mild loss of lordosis Motion: Flexion 40 deg, Extension 25 deg, Side Bending to 35 deg bilaterally, Rotation to 35 deg bilaterally  SLR laying: Negative  XSLR laying: Negative  Palpable tenderness: mild TTP in paraspinal musculature TL  FABER: Tightness bilaterally. Sensory change: Gross sensation intact to all lumbar and sacral dermatomes.  Reflexes: 2+ at both patellar tendons, 2+ at achilles tendons, Babinski's downgoing.  Strength at foot  Plantar-flexion: 5/5 Dorsi-flexion: 5/5 Eversion: 5/5 Inversion: 5/5  Leg strength  Quad: 5/5 Hamstring: 5/5 Hip flexor: 5/5 Hip abductors: 4/5  Gait unremarkable.  Osteopathic findings  C4 flexed rotated and side bent left C6 flexed rotated and side bent left T3 extended rotated and side bent right inhaled third rib T9 extended rotated and side bent left L3 flexed rotated and side bent right Sacrum right on right    Impression and Recommendations:     This case required medical decision making of moderate complexity. The above documentation has been reviewed and is accurate and complete Judi SaaZachary M Lakenzie Mcclafferty, DO       Note: This dictation was prepared with Dragon dictation along with smaller phrase technology. Any transcriptional errors that result from this process are  unintentional.

## 2018-05-22 ENCOUNTER — Encounter: Payer: Self-pay | Admitting: Family Medicine

## 2018-05-22 ENCOUNTER — Ambulatory Visit (INDEPENDENT_AMBULATORY_CARE_PROVIDER_SITE_OTHER): Payer: BLUE CROSS/BLUE SHIELD | Admitting: Family Medicine

## 2018-05-22 VITALS — BP 100/68 | HR 103 | Ht 66.0 in | Wt 149.0 lb

## 2018-05-22 DIAGNOSIS — M999 Biomechanical lesion, unspecified: Secondary | ICD-10-CM | POA: Diagnosis not present

## 2018-05-22 DIAGNOSIS — M24559 Contracture, unspecified hip: Secondary | ICD-10-CM | POA: Diagnosis not present

## 2018-05-22 NOTE — Assessment & Plan Note (Signed)
Tightness to movement.  Some of tightness in the neck.  I do think underlying anxiety is contributing.  Discussed icing regimen and home exercise.  Follow-up again in 4 to 8 weeks

## 2018-05-22 NOTE — Assessment & Plan Note (Signed)
Decision today to treat with OMT was based on Physical Exam  After verbal consent patient was treated with HVLA, ME, FPR techniques in cervical, thoracic, lumbar and sacral areas  Patient tolerated the procedure well with improvement in symptoms  Patient given exercises, stretches and lifestyle modifications  See medications in patient instructions if given  Patient will follow up in 4 weeks 

## 2018-05-22 NOTE — Patient Instructions (Signed)
Good to see you  Mouth guard at night New pillow  Prilosec daily for 2 weeks.  Probiotic with at least 10 different strains and 10 different units See me again in 6 weeks but write me in 3 weeks so I know how you are doing

## 2018-06-07 ENCOUNTER — Encounter: Payer: Self-pay | Admitting: Physical Therapy

## 2018-06-07 DIAGNOSIS — J029 Acute pharyngitis, unspecified: Secondary | ICD-10-CM | POA: Diagnosis not present

## 2018-06-07 NOTE — Therapy (Signed)
[facility] MAIN [facility] SERVICES [address] [place], [place], [address] Phone: [phone]   Fax:  [phone]  Patient Details  Name: Katherine Schultz MRN: [id number] Date of Birth: [birth year] Referring Provider:  Sherron Monday Encounter Date: 06/07/2018   Discharge Summary   Pt has self-discharged from pelvic PT.  On 04/26/18, pt noticed front desk staff she no longer needs Pelvic PT.    Across 5 visits, pt achieved 4/7 goals. Her remaining goals were related to outcome measure scores which were not able to be obtained prior to her self-d/c.  Pt had improvements with less burning in her pelvic floor mm, less difficulty with eliminating urine, less LBP, and pelvic pain.  Pt has demonstrated no more pelvic floor tightness at all 3 layers, increased ability to completely lengthen her pelvic floor mm without straining of abdominal mm. Pt also demo'd a more equal aligned pelvic girdle with increased SIJ mobility along with thoracic mobility with better awareness of stabilization principles in upright, fitness, and dance postures to account for her hypermobile disposition as a dnacer. Pt benefitted greatly from manual Tx which no longer addressed her pelvic area and spine but also addressed her lower kinetic chain at her knees and feet. Pt gained increased foot mobility and better strategies and eccentric control with deep core mm co-activation when jumping and landing while dancing which is her passion, hobby, and side job. Pt also benefited greatly from relaxation practices which helped her to decrease tightness of pelvic floor and manage urinary   Pt reports she has noticed when she connect the two between stress and pelvic floor, and practicing the breathing has helped so much. Pt was able to perform a 2 hours dance performance and notice the moment when she noticed pelvic pain, pt was able to release the tensions with breathing and body awareness when landing.  Before  in the past,it took 2 hourus to get out of the pain after dancing.  Pt loves dancing and now she feels she is able to go into performances with more confidence and not have to prepare herself mentally to feel pain.  Thank you for your referral!   Mariane Masters ,PT, DPT, E-RYT  06/07/2018, 9:39 AM  [place] MAIN [facility] SERVICES [address] [place], [place], [address] Phone: [phone]   Fax:  [phone]

## 2018-07-03 ENCOUNTER — Ambulatory Visit: Payer: BLUE CROSS/BLUE SHIELD | Admitting: Family Medicine

## 2018-07-03 ENCOUNTER — Other Ambulatory Visit: Payer: Self-pay

## 2018-07-03 ENCOUNTER — Encounter: Payer: Self-pay | Admitting: Family Medicine

## 2018-07-03 VITALS — BP 110/70 | HR 93 | Ht 66.0 in

## 2018-07-03 DIAGNOSIS — M24851 Other specific joint derangements of right hip, not elsewhere classified: Secondary | ICD-10-CM

## 2018-07-03 DIAGNOSIS — M999 Biomechanical lesion, unspecified: Secondary | ICD-10-CM | POA: Diagnosis not present

## 2018-07-03 NOTE — Patient Instructions (Signed)
Good to see you  I would go back on the prilosec.  Stay active Thigh compression with running, shorten stride and get rid of the incline for now.  See me again in 6ish weeks

## 2018-07-03 NOTE — Assessment & Plan Note (Signed)
Some of them causing some of the hand pain.  We discussed with patient normal hip flexor tightness.  We discussed that this can be contributing as well.  Discussed core strengthening and stability.  Follow-up again in 6 weeks

## 2018-07-03 NOTE — Assessment & Plan Note (Signed)
Decision today to treat with OMT was based on Physical Exam  After verbal consent patient was treated with HVLA, ME, FPR techniques in cervical, thoracic, lumbar and sacral areas  Patient tolerated the procedure well with improvement in symptoms  Patient given exercises, stretches and lifestyle modifications  See medications in patient instructions if given  Patient will follow up in 6 weeks 

## 2018-07-03 NOTE — Progress Notes (Signed)
Katherine Schultz [address] [place], [place] [address] Phone: [phone] Subjective:   I Katherine Schultz am serving as a Neurosurgeon for Dr. Antoine Primas.  I'm seeing this patient by the request  of:    CC: Back pain and neck pain follow-up  Katherine Schultz   05/22/2018 Tightness to movement.  Some of tightness in the neck.  I do think underlying anxiety is contributing.  Discussed icing regimen and home exercise.  Follow-up again in 4 to 8 weeks  07/03/2018 Katherine Schultz is a 23 y.o. female coming in with complaint of neck pain. States her neck is still a little stiff. States it is better than last time.  Noticing a little more increasing mid back pain.  Trying to workout on a more regular basis.  Getting married in November.    Past Medical History:  Diagnosis Date  . Arrhythmia    Tachycardia  . GERD (gastroesophageal reflux disease)   . IC (interstitial cystitis)   . Wolff-Parkinson-White (WPW) syndrome    Past Surgical History:  Procedure Laterality Date  . CYSTOSCOPY  2017  . WISDOM TOOTH EXTRACTION  2012   Social History   Socioeconomic History  . Marital status: Single    Spouse name: Not on file  . Number of children: Not on file  . Years of education: Not on file  . Highest education level: Not on file  Occupational History  . Not on file  Social Needs  . Financial resource strain: Not on file  . Food insecurity:    Worry: Not on file    Inability: Not on file  . Transportation needs:    Medical: Not on file    Non-medical: Not on file  Tobacco Use  . Smoking status: Never Smoker  . Smokeless tobacco: Never Used  Substance and Sexual Activity  . Alcohol use: No  . Drug use: No  . Sexual activity: Not Currently    Birth control/protection: Pill  Lifestyle  . Physical activity:    Days per week: Not on file    Minutes per session: Not on file  . Stress: Not on file  Relationships  . Social connections:    Talks on phone: Not  on file    Gets together: Not on file    Attends religious service: Not on file    Active member of club or organization: Not on file    Attends meetings of clubs or organizations: Not on file    Relationship status: Not on file  Other Topics Concern  . Not on file  Social History Narrative  . Not on file   No Known Allergies History reviewed. No pertinent family history.  Current Outpatient Medications (Endocrine & Metabolic):  .  norethindrone (MICRONOR,CAMILA,ERRIN) 0.35 MG tablet, Take 1 tablet (0.35 mg total) by mouth daily.    Current Outpatient Medications (Analgesics):  .  naproxen (NAPROSYN) 500 MG tablet, Take 1 tablet (500 mg total) by mouth 2 (two) times daily with a meal. As needed for pain   Current Outpatient Medications (Other):  Marland Kitchen  Vitamin D, Ergocalciferol, (DRISDOL) 1.25 MG (50000 UT) CAPS capsule, Take 1 capsule (50,000 Units total) by mouth every 7 (seven) days.    Past medical history, social, surgical and family history all reviewed in electronic medical record.  No pertanent information unless stated regarding to the chief complaint.   Review of Systems:  No headache, visual changes, nausea, vomiting, diarrhea, constipation, dizziness, abdominal pain, skin  rash, fevers, chills, night sweats, weight loss, swollen lymph nodes, body aches, joint swelling, muscle aches, chest pain, shortness of breath, mood changes.   Objective  Blood pressure 110/70, pulse 93, height 5\' 6"  (1.676 m), SpO2 99 %.    General: No apparent distress alert and oriented x3 mood and affect normal, dressed appropriately.  HEENT: Pupils equal, extraocular movements intact  Respiratory: Patient's speak in full sentences and does not appear short of breath  Cardiovascular: No lower extremity edema, non tender, no erythema  Skin: Warm dry intact with no signs of infection or rash on extremities or on axial skeleton.  Abdomen: Soft nontender  Neuro: Cranial nerves II through XII are  intact, neurovascularly intact in all extremities with 2+ DTRs and 2+ pulses.  Lymph: No lymphadenopathy of posterior or anterior cervical chain or axillae bilaterally.  Gait normal with good balance and coordination.  MSK:  Non tender with full range of motion and good stability and symmetric strength and tone of shoulders, elbows, wrist, hip, knee and ankles bilaterally.  Mild hypermobility Neck: Inspection unremarkable. No palpable stepoffs. Negative Spurling's maneuver. Mild limited range of motion lacking last 5 degrees of extension Grip strength and sensation normal in bilateral hands Strength good C4 to T1 distribution No sensory change to C4 to T1 Negative Hoffman sign bilaterally Reflexes normal Tightness of the trapezius bilaterally  Back Exam:  Inspection: Loss of lordosis Motion: Flexion 45 deg, Extension 25 deg, Side Bending to 35 deg bilaterally,  Rotation to 45 deg bilaterally  SLR laying: Negative  XSLR laying: Negative  Palpable tenderness: To palpation of paraspinal musculature lumbar spine right greater than left.Marland Kitchen FABER: Tightness on the right. Sensory change: Gross sensation intact to all lumbar and sacral dermatomes.  Reflexes: 2+ at both patellar tendons, 2+ at achilles tendons, Babinski's downgoing.  Strength at foot  Plantar-flexion: 5/5 Dorsi-flexion: 5/5 Eversion: 5/5 Inversion: 5/5  Leg strength  Quad: 5/5 Hamstring: 5/5 Hip flexor: 5/5 Hip abductors: 5/5  Gait unremarkable.  Osteopathic findings  C6 flexed rotated and side bent left T3 extended rotated and side bent right inhaled third rib T9 extended rotated and side bent left L3 flexed rotated and side bent right Sacrum right on right    Impression and Recommendations:     This case required medical decision making of moderate complexity. The above documentation has been reviewed and is accurate and complete Judi Saa, DO       Note: This dictation was prepared with Dragon  dictation along with smaller phrase technology. Any transcriptional errors that result from this process are unintentional.

## 2018-07-31 ENCOUNTER — Telehealth: Payer: Self-pay | Admitting: *Deleted

## 2018-07-31 NOTE — Telephone Encounter (Signed)
Left a message for the patient to call back to call back to switch the appointment to a web visit.

## 2018-08-01 NOTE — Telephone Encounter (Signed)
Left a message to call back about switching the visit to a web visit.

## 2018-08-02 ENCOUNTER — Encounter

## 2018-08-02 ENCOUNTER — Telehealth: Payer: Self-pay

## 2018-08-02 ENCOUNTER — Ambulatory Visit: Payer: BLUE CROSS/BLUE SHIELD | Admitting: Cardiovascular Disease

## 2018-08-02 NOTE — Telephone Encounter (Signed)
I notified patient of appointment today due to COVID 19 need to change visit to [facility] Visit if she would consent to this. The patient stated, she is not having any problems of chest pain now and would rather wait until she can be seen in the office and will call back in May for a July new patient appointment. The patient also stated if she has the chest pain again she will call the office back to schedule a [facility] visit.

## 2018-08-14 ENCOUNTER — Ambulatory Visit: Payer: BLUE CROSS/BLUE SHIELD | Admitting: Family Medicine

## 2018-08-21 ENCOUNTER — Encounter: Payer: Self-pay | Admitting: Family Medicine

## 2018-08-21 ENCOUNTER — Other Ambulatory Visit: Payer: Self-pay

## 2018-08-21 ENCOUNTER — Ambulatory Visit: Payer: BLUE CROSS/BLUE SHIELD | Admitting: Family Medicine

## 2018-08-21 VITALS — BP 100/72 | HR 94 | Ht 66.0 in | Wt 143.0 lb

## 2018-08-21 DIAGNOSIS — M24559 Contracture, unspecified hip: Secondary | ICD-10-CM

## 2018-08-21 DIAGNOSIS — M999 Biomechanical lesion, unspecified: Secondary | ICD-10-CM

## 2018-08-21 NOTE — Progress Notes (Signed)
Katherine Schultz D.O. [facility] [address] [place], [place] [address] Phone: [phone] Subjective:   I Katherine NighKana Schultz am serving as a Neurosurgeonscribe for Dr. Antoine PrimasZachary Schultz.   CC: back pain   BMW:[id number]:Subjective  Katherine ExonKaitlyn Schultz is a 23 y.o. female coming in with complaint of back pain. States that her lower back is still a little sore.  Patient will has been doing little more activities.  Has noticed some more neck pain.  Does think it is associated with more anxiety recently.  Patient rates the severity of pain is 5 out of 10.       Past Medical History:  Diagnosis Date  . Arrhythmia    Tachycardia  . GERD (gastroesophageal reflux disease)   . IC (interstitial cystitis)   . Wolff-Parkinson-White (WPW) syndrome    Past Surgical History:  Procedure Laterality Date  . CYSTOSCOPY  2017  . WISDOM TOOTH EXTRACTION  2012   Social History   Socioeconomic History  . Marital status: Single    Spouse name: Not on file  . Number of children: Not on file  . Years of education: Not on file  . Highest education level: Not on file  Occupational History  . Not on file  Social Needs  . Financial resource strain: Not on file  . Food insecurity:    Worry: Not on file    Inability: Not on file  . Transportation needs:    Medical: Not on file    Non-medical: Not on file  Tobacco Use  . Smoking status: Never Smoker  . Smokeless tobacco: Never Used  Substance and Sexual Activity  . Alcohol use: No  . Drug use: No  . Sexual activity: Not Currently    Birth control/protection: Pill  Lifestyle  . Physical activity:    Days per week: Not on file    Minutes per session: Not on file  . Stress: Not on file  Relationships  . Social connections:    Talks on phone: Not on file    Gets together: Not on file    Attends religious service: Not on file    Active member of club or organization: Not on file    Attends meetings of clubs or organizations: Not on file    Relationship status:  Not on file  Other Topics Concern  . Not on file  Social History Narrative  . Not on file   No Known Allergies No family history on file.  Current Outpatient Medications (Endocrine & Metabolic):  .  norethindrone (MICRONOR,CAMILA,ERRIN) 0.35 MG tablet, Take 1 tablet (0.35 mg total) by mouth daily.    Current Outpatient Medications (Analgesics):  .  naproxen (NAPROSYN) 500 MG tablet, Take 1 tablet (500 mg total) by mouth 2 (two) times daily with a meal. As needed for pain   Current Outpatient Medications (Other):  Marland Kitchen.  Vitamin D, Ergocalciferol, (DRISDOL) 1.25 MG (50000 UT) CAPS capsule, Take 1 capsule (50,000 Units total) by mouth every 7 (seven) days.    Past medical history, social, surgical and family history all reviewed in electronic medical record.  No pertanent information unless stated regarding to the chief complaint.   Review of Systems:  No headache, visual changes, nausea, vomiting, diarrhea, constipation, dizziness, abdominal pain, skin rash, fevers, chills, night sweats, weight loss, swollen lymph nodes, body aches, joint swelling, chest pain, shortness of breath, mood changes.  Positive muscle aches  Objective  Blood pressure 100/72, pulse 94, height 5\' 6"  (1.676 m), weight  143 lb (64.9 kg), SpO2 98 %.    General: No apparent distress alert and oriented x3 mood and affect normal, dressed appropriately.  HEENT: Pupils equal, extraocular movements intact  Respiratory: Patient's speak in full sentences and does not appear short of breath  Cardiovascular: No lower extremity edema, non tender, no erythema  Skin: Warm dry intact with no signs of infection or rash on extremities or on axial skeleton.  Abdomen: Soft nontender  Neuro: Cranial nerves II through XII are intact, neurovascularly intact in all extremities with 2+ DTRs and 2+ pulses.  Lymph: No lymphadenopathy of posterior or anterior cervical chain or axillae bilaterally.  Gait normal with good balance and  coordination.  MSK:  Non tender with full range of motion and good stability and symmetric strength and tone of shoulders, elbows, wrist, hip, knee and ankles bilaterally.  Back Exam:  Inspection: Unremarkable  Motion: Flexion 45 deg, Extension 45 deg, Side Bending to 45 deg bilaterally,  Rotation to 45 deg bilaterally  SLR laying: Negative  XSLR laying: Negative  Palpable tenderness: None. FABER: negative. Sensory change: Gross sensation intact to all lumbar and sacral dermatomes.  Reflexes: 2+ at both patellar tendons, 2+ at achilles tendons, Babinski's downgoing.  Strength at foot  Plantar-flexion: 5/5 Dorsi-flexion: 5/5 Eversion: 5/5 Inversion: 5/5  Leg strength  Quad: 5/5 Hamstring: 5/5 Hip flexor: 5/5 Hip abductors: 5/5  Gait unremarkable.    Osteopathic findings  C4 flexed rotated and side bent left C6 flexed rotated and side bent left T3 extended rotated and side bent right inhaled third rib T9 extended rotated and side bent left L2 flexed rotated and side bent right Sacrum right on right  Impression and Recommendations:     This case required medical decision making of moderate complexity. The above documentation has been reviewed and is accurate and complete Judi Saa, DO       Note: This dictation was prepared with Dragon dictation along with smaller phrase technology. Any transcriptional errors that result from this process are unintentional.

## 2018-08-21 NOTE — Assessment & Plan Note (Signed)
Tightness of the hip flexors.  I do believe that anxiety could be playing a role as well.  We discussed which ones to do which activities to avoid.  Discussed home exercise.  Discussed icing regimen.  Patient will increase activity slowly.  Follow-up with me again in 4 to 8 weeks.

## 2018-08-21 NOTE — Patient Instructions (Signed)
Good to see you  Eosinophilic esophagitis Zyrtec at night See me againin 5 weeks

## 2018-08-21 NOTE — Assessment & Plan Note (Signed)
Decision today to treat with OMT was based on Physical Exam  After verbal consent patient was treated with HVLA, ME, FPR techniques in cervical, thoracic, rib lumbar and sacral areas  Patient tolerated the procedure well with improvement in symptoms  Patient given exercises, stretches and lifestyle modifications  See medications in patient instructions if given  Patient will follow up in 4-6 weeks 

## 2018-09-10 ENCOUNTER — Telehealth: Payer: Self-pay | Admitting: Obstetrics and Gynecology

## 2018-09-10 NOTE — Telephone Encounter (Signed)
Copied from CRM [id number]. Topic: Appointment Scheduling - Scheduling Inquiry for Clinic >> Sep 10, 2018  4:39 PM Katherine Schultz wrote: CRM for notification. See Telephone encounter for: 09/10/18.  Patient is calling to see if she can reschedule her appt with Dr. Katrinka Blazing tomorrow  Please advise. [phone] (M)

## 2018-09-10 NOTE — Telephone Encounter (Signed)
Rescheduled

## 2018-09-11 ENCOUNTER — Ambulatory Visit: Payer: BLUE CROSS/BLUE SHIELD | Admitting: Family Medicine

## 2018-09-16 NOTE — Progress Notes (Signed)
Tawana ScaleZach Schultz D.O. [facility] [address] [place], [place] [address] Phone: [phone] Subjective:   I Katherine NighKana Schultz am serving as a Neurosurgeonscribe for Dr. Antoine PrimasZachary Schultz.   CC: Back pain follow-up  OZH:[id number]:Subjective  Katherine ExonKaitlyn Willis is a 23 y.o. female coming in with complaint of back pain. States she still has pain in her mid back.  Patient has been doing relatively well though with conservative therapy.  Some increasing stress recently.  Patient is planning her wedding for later this year.  Patient is also working on a more regular basis.  Trying to workout but not doing it well.  In addition of this patient's heartburn seems to be worsening.  Has noticed an association with this as well as her upper back pain.  Patient states that it did improve significantly when she was taking the Prilosec regularly.  Patient is wondering if there is any more that she would need to do.    Past Medical History:  Diagnosis Date  . Arrhythmia    Tachycardia  . GERD (gastroesophageal reflux disease)   . IC (interstitial cystitis)   . Wolff-Parkinson-White (WPW) syndrome    Past Surgical History:  Procedure Laterality Date  . CYSTOSCOPY  2017  . WISDOM TOOTH EXTRACTION  2012   Social History   Socioeconomic History  . Marital status: Single    Spouse name: Not on file  . Number of children: Not on file  . Years of education: Not on file  . Highest education level: Not on file  Occupational History  . Not on file  Social Needs  . Financial resource strain: Not on file  . Food insecurity:    Worry: Not on file    Inability: Not on file  . Transportation needs:    Medical: Not on file    Non-medical: Not on file  Tobacco Use  . Smoking status: Never Smoker  . Smokeless tobacco: Never Used  Substance and Sexual Activity  . Alcohol use: No  . Drug use: No  . Sexual activity: Not Currently    Birth control/protection: Pill  Lifestyle  . Physical activity:    Days per week: Not on  file    Minutes per session: Not on file  . Stress: Not on file  Relationships  . Social connections:    Talks on phone: Not on file    Gets together: Not on file    Attends religious service: Not on file    Active member of club or organization: Not on file    Attends meetings of clubs or organizations: Not on file    Relationship status: Not on file  Other Topics Concern  . Not on file  Social History Narrative  . Not on file   No Known Allergies History reviewed. No pertinent family history.  Current Outpatient Medications (Endocrine & Metabolic):  .  norethindrone (MICRONOR,CAMILA,ERRIN) 0.35 MG tablet, Take 1 tablet (0.35 mg total) by mouth daily.    Current Outpatient Medications (Analgesics):  .  naproxen (NAPROSYN) 500 MG tablet, Take 1 tablet (500 mg total) by mouth 2 (two) times daily with a meal. As needed for pain   Current Outpatient Medications (Other):  Marland Kitchen.  Vitamin D, Ergocalciferol, (DRISDOL) 1.25 MG (50000 UT) CAPS capsule, Take 1 capsule (50,000 Units total) by mouth every 7 (seven) days.    Past medical history, social, surgical and family history all reviewed in electronic medical record.  No pertanent information unless stated regarding to the  chief complaint.   Review of Systems:  No headache, visual changes, nausea, vomiting, diarrhea, constipation, dizziness, skin rash, fevers, chills, night sweats, weight loss, swollen lymph nodes, body aches, joint swelling,  chest pain, shortness of breath, mood changes.  Positive abdominal pain, muscle aches  Objective  Blood pressure 110/70, pulse 88, height 5\' 6"  (1.676 m), weight 147 lb (66.7 kg), SpO2 98 %.    General: No apparent distress alert and oriented x3 mood and affect normal, dressed appropriately.  HEENT: Pupils equal, extraocular movements intact  Respiratory: Patient's speak in full sentences and does not appear short of breath  Cardiovascular: No lower extremity edema, non tender, no erythema   Skin: Warm dry intact with no signs of infection or rash on extremities or on axial skeleton.  Abdomen: Soft nontender  Neuro: Cranial nerves II through XII are intact, neurovascularly intact in all extremities with 2+ DTRs and 2+ pulses.  Lymph: No lymphadenopathy of posterior or anterior cervical chain or axillae bilaterally.  Gait normal with good balance and coordination.  MSK:  Non tender with full range of motion and good stability and symmetric strength and tone of shoulders, elbows, wrist, hip, knee and ankles bilaterally.  Back Exam:  Inspection: Unremarkable  Motion: Flexion 30 deg, Extension 25 deg, Side Bending to 35 deg bilaterally,  Rotation to 45 deg bilaterally  SLR laying: Negative  XSLR laying: Negative  Palpable tenderness: Tender to palpation of paraspinal musculature more in the thoracolumbar and lumbosacral areas right greater than left FABER: negative. Sensory change: Gross sensation intact to all lumbar and sacral dermatomes.  Reflexes: 2+ at both patellar tendons, 2+ at achilles tendons, Babinski's downgoing.  Strength at foot  Plantar-flexion: 5/5 Dorsi-flexion: 5/5 Eversion: 5/5 Inversion: 5/5  Leg strength  Quad: 5/5 Hamstring: 5/5 Hip flexor: 5/5 Hip abductors: 5/5  Gait unremarkable.  Osteopathic findings C6 flexed rotated and side bent left T5 extended rotated and side bent right inhaled rib T9 extended rotated and side bent left L2 flexed rotated and side bent right Sacrum right on right    Impression and Recommendations:     This case required medical decision making of moderate complexity. The above documentation has been reviewed and is accurate and complete Judi Saa, DO       Note: This dictation was prepared with Dragon dictation along with smaller phrase technology. Any transcriptional errors that result from this process are unintentional.

## 2018-09-17 ENCOUNTER — Encounter: Payer: Self-pay | Admitting: Family Medicine

## 2018-09-17 ENCOUNTER — Ambulatory Visit (INDEPENDENT_AMBULATORY_CARE_PROVIDER_SITE_OTHER): Payer: BLUE CROSS/BLUE SHIELD | Admitting: Family Medicine

## 2018-09-17 ENCOUNTER — Other Ambulatory Visit: Payer: Self-pay

## 2018-09-17 VITALS — BP 110/70 | HR 88 | Ht 66.0 in | Wt 147.0 lb

## 2018-09-17 DIAGNOSIS — K219 Gastro-esophageal reflux disease without esophagitis: Secondary | ICD-10-CM | POA: Diagnosis not present

## 2018-09-17 DIAGNOSIS — M999 Biomechanical lesion, unspecified: Secondary | ICD-10-CM

## 2018-09-17 DIAGNOSIS — M24559 Contracture, unspecified hip: Secondary | ICD-10-CM | POA: Diagnosis not present

## 2018-09-17 DIAGNOSIS — R1013 Epigastric pain: Secondary | ICD-10-CM

## 2018-09-17 NOTE — Assessment & Plan Note (Signed)
Patient continues to have some dyspepsia.  Patient has findings that are concerning for possible reflux disease.  Does respond well to PPI but continues to have some difficulty.  Has had some history of lactose intolerance.  Stress is likely related.  Possibly bunions endoscopy and will send to gastroenterology for further evaluation and treatment.

## 2018-09-17 NOTE — Patient Instructions (Signed)
You are awesome Gi will call

## 2018-09-17 NOTE — Assessment & Plan Note (Signed)
Has had tightness overall.  Discussed with patient about posture and ergonomics.  Discussed which activities to do which wants to avoid.  Patient is to increase activity slowly over the course of next several days.  Follow-up again in 4 to 8 weeks

## 2018-09-17 NOTE — Assessment & Plan Note (Signed)
Decision today to treat with OMT was based on Physical Exam  After verbal consent patient was treated with HVLA, ME, FPR techniques in cervical, thoracic, rib lumbar and sacral areas  Patient tolerated the procedure well with improvement in symptoms  Patient given exercises, stretches and lifestyle modifications  See medications in patient instructions if given  Patient will follow up in 4-8 weeks 

## 2018-10-23 ENCOUNTER — Encounter: Payer: Self-pay | Admitting: Family Medicine

## 2018-10-23 ENCOUNTER — Other Ambulatory Visit: Payer: Self-pay

## 2018-10-23 ENCOUNTER — Ambulatory Visit (INDEPENDENT_AMBULATORY_CARE_PROVIDER_SITE_OTHER): Payer: BC Managed Care – PPO | Admitting: Family Medicine

## 2018-10-23 VITALS — BP 106/82 | Ht 66.0 in | Wt 146.0 lb

## 2018-10-23 DIAGNOSIS — M24559 Contracture, unspecified hip: Secondary | ICD-10-CM | POA: Diagnosis not present

## 2018-10-23 DIAGNOSIS — M999 Biomechanical lesion, unspecified: Secondary | ICD-10-CM

## 2018-10-23 NOTE — Assessment & Plan Note (Signed)
Hip flexor tightness Patient seen previously.  Discussed with patient in great length about icing regimen, home exercises, which activities to do which wants to avoid.  Discussed posture and ergonomics.  Patient will follow-up with me again 6 to 12 weeks

## 2018-10-23 NOTE — Assessment & Plan Note (Signed)
Decision today to treat with OMT was based on Physical Exam  After verbal consent patient was treated with HVLA, ME, FPR techniques in cervical, thoracic, rib, lumbar and sacral areas  Patient tolerated the procedure well with improvement in symptoms  Patient given exercises, stretches and lifestyle modifications  See medications in patient instructions if given  Patient will follow up in 6-12 weeks 

## 2018-10-23 NOTE — Progress Notes (Signed)
Katherine Schultz, [place] [address] Phone: [phone] Subjective:   Katherine Schultz, am serving as a scribe for Dr. Hulan Saas.   CC: Back pain follow-up  CZY:[id number]  Katherine Schultz is a 23 y.o. female coming in with complaint of back pain. Tightness due to starting to run and do stair climber. Otherwise feeling good.  Patient continues to try to increase her activity.  Patient is working out with her brother on a regular basis.  Continues with vitamin D supplementation.    Past Medical History:  Diagnosis Date  . Arrhythmia    Tachycardia  . GERD (gastroesophageal reflux disease)   . IC (interstitial cystitis)   . Wolff-Parkinson-White (WPW) syndrome    Past Surgical History:  Procedure Laterality Date  . CYSTOSCOPY  2017  . WISDOM TOOTH EXTRACTION  2012   Social History   Socioeconomic History  . Marital status: Single    Spouse name: Not on file  . Number of children: Not on file  . Years of education: Not on file  . Highest education level: Not on file  Occupational History  . Not on file  Social Needs  . Financial resource strain: Not on file  . Food insecurity    Worry: Not on file    Inability: Not on file  . Transportation needs    Medical: Not on file    Non-medical: Not on file  Tobacco Use  . Smoking status: Never Smoker  . Smokeless tobacco: Never Used  Substance and Sexual Activity  . Alcohol use: Schultz  . Drug use: Schultz  . Sexual activity: Not Currently    Birth control/protection: Pill  Lifestyle  . Physical activity    Days per week: Not on file    Minutes per session: Not on file  . Stress: Not on file  Relationships  . Social Herbalist on phone: Not on file    Gets together: Not on file    Attends religious service: Not on file    Active member of club or organization: Not on file    Attends meetings of clubs or organizations: Not on file    Relationship status: Not on file   Other Topics Concern  . Not on file  Social History Narrative  . Not on file   Schultz Known Allergies Schultz family history on file.  Current Outpatient Medications (Endocrine & Metabolic):  .  norethindrone (MICRONOR,CAMILA,ERRIN) 0.35 MG tablet, Take 1 tablet (0.35 mg total) by mouth daily.    Current Outpatient Medications (Analgesics):  .  naproxen (NAPROSYN) 500 MG tablet, Take 1 tablet (500 mg total) by mouth 2 (two) times daily with a meal. As needed for pain   Current Outpatient Medications (Other):  Marland Kitchen  Vitamin D, Ergocalciferol, (DRISDOL) 1.25 MG (50000 UT) CAPS capsule, Take 1 capsule (50,000 Units total) by mouth every 7 (seven) days.    Past medical history, social, surgical and family history all reviewed in electronic medical record.  Schultz pertanent information unless stated regarding to the chief complaint.   Review of Systems:  Schultz headache, visual changes, nausea, vomiting, diarrhea, constipation, dizziness, abdominal pain, skin rash, fevers, chills, night sweats, weight loss, swollen lymph nodes, body aches, joint swelling, chest pain, shortness of breath, mood changes.  Mild positive muscle aches  Objective  Blood pressure 106/82, height 5\' 6"  (1.676 Schultz), weight 146 lb (66.2 kg).   General: Schultz apparent distress alert and  oriented x3 mood and affect normal, dressed appropriately.  HEENT: Pupils equal, extraocular movements intact  Respiratory: Patient's speak in full sentences and does not appear short of breath  Cardiovascular: Schultz lower extremity edema, non tender, Schultz erythema  Skin: Warm dry intact with Schultz signs of infection or rash on extremities or on axial skeleton.  Abdomen: Soft nontender  Neuro: Cranial nerves II through XII are intact, neurovascularly intact in all extremities with 2+ DTRs and 2+ pulses.  Lymph: Schultz lymphadenopathy of posterior or anterior cervical chain or axillae bilaterally.  Gait normal with good balance and coordination.  MSK:  Non tender  with full range of motion and good stability and symmetric strength and tone of shoulders, elbows, wrist, hip, knee and ankles bilaterally.  Neck: Inspection loss of lordosis. Schultz palpable stepoffs. Negative Spurling's maneuver. Full neck range of motion Grip strength and sensation normal in bilateral hands Strength good C4 to T1 distribution Schultz sensory change to C4 to T1 Negative Hoffman sign bilaterally Reflexes normal  Back Exam:  Inspection: Mild loss of lordosis Motion: Flexion 45 deg, Extension 20 deg, Side Bending to 45 deg bilaterally,  Rotation to 45 deg bilaterally  SLR laying: Negative  XSLR laying: Negative  Palpable tenderness: Tightness in the right sacral area. FABER: Positive Faber. Sensory change: Gross sensation intact to all lumbar and sacral dermatomes.  Reflexes: 2+ at both patellar tendons, 2+ at achilles tendons, Babinski's downgoing.  Strength at foot  Plantar-flexion: 5/5 Dorsi-flexion: 5/5 Eversion: 5/5 Inversion: 5/5  Leg strength  Quad: 5/5 Hamstring: 5/5 Hip flexor: 5/5 Hip abductors: 5/5  Gait unremarkable.  Osteopathic findings C2 flexed rotated and side bent right C6 flexed rotated and side bent left T3 extended rotated and side bent right inhaled third rib L2 flexed rotated and side bent right Sacrum right on right    Impression and Recommendations:     This case required medical decision making of moderate complexity. The above documentation has been reviewed and is accurate and complete Katherine Schultz , DO       Note: This dictation was prepared with Dragon dictation along with smaller phrase technology. Any transcriptional errors that result from this process are unintentional.

## 2018-11-28 ENCOUNTER — Ambulatory Visit: Payer: BC Managed Care – PPO | Admitting: Family Medicine

## 2018-12-18 DIAGNOSIS — Z6823 Body mass index (BMI) 23.0-23.9, adult: Secondary | ICD-10-CM | POA: Diagnosis not present

## 2018-12-18 DIAGNOSIS — Z20828 Contact with and (suspected) exposure to other viral communicable diseases: Secondary | ICD-10-CM | POA: Diagnosis not present

## 2019-01-13 ENCOUNTER — Telehealth: Payer: Self-pay

## 2019-01-13 NOTE — Telephone Encounter (Signed)
Called to confirm patient wanted to cancel appointment. Appointment has been canceled. Patient states she will call back to reschedule.

## 2019-01-14 ENCOUNTER — Ambulatory Visit: Payer: BC Managed Care – PPO | Admitting: Family Medicine

## 2019-01-30 ENCOUNTER — Telehealth: Payer: Self-pay

## 2019-01-30 NOTE — Telephone Encounter (Signed)
Patient called. She tested positive for COVID about 10 days ago. Is still having symptoms and would like to speak with you about what she can do for the symptoms. Phone number is [phone].

## 2019-01-30 NOTE — Telephone Encounter (Signed)
Also discussed if worsening symptoms to seek medical attention immediately or dial 911

## 2019-01-30 NOTE — Telephone Encounter (Signed)
Returned patient's call this evening.  Discussed with her.  Patient is now on day 13 of a symptoms.  Was tested 10 days ago and had a positive coronavirus test.  Patient has had intermittent bouts of fever.  Recently though had had decreased in the fevers been started to feel like she was having more chest pressure.  Feels a little more fatigue recently.  Denies any cough.  Has lost taste and smell.  Patient is somewhat concerned with how long duration this is been.  Discussed with her at this time about the proper self-care and continuing the isolation but with the shortness of breath I would encourage patient to potentially seek medical attention to check pulse ox as well as a chest x-ray.  Patient states at the moment she feels fine and understands recommendations but will consider doing it in the morning.

## 2019-01-31 ENCOUNTER — Other Ambulatory Visit
Admission: RE | Admit: 2019-01-31 | Discharge: 2019-01-31 | Disposition: A | Discharge status: Home / Self Care | Payer: BC Managed Care – PPO | Source: Ambulatory Visit | Attending: Family Medicine | Admitting: Family Medicine

## 2019-01-31 DIAGNOSIS — R0789 Other chest pain: Secondary | ICD-10-CM | POA: Diagnosis present

## 2019-01-31 LAB — FIBRIN DERIVATIVES D-DIMER (ARMC ONLY): Fibrin derivatives D-dimer (ARMC): 205.4 ng/mL (FEU) (ref 0.00–499.00)

## 2019-04-25 NOTE — L&D Delivery Note (Signed)
Delivery Note:   G1P0 at [redacted]w[redacted]d  Admitting diagnosis: Encounter for induction of labor [Z34.90] Risks: GBS +, Wolff-Parkinson-White syndrome Onset of labor: 01/29/20 @ 2255 IOL/Augmentation: AROM and Cytotec ROM: 01/30/20 @ 0207  Complete dilation at 01/30/2020  0400 Onset of pushing at 0400 FHR second stage Cat 1  Analgesia /Anesthesia intrapartum:Epidural  Pushing in hands and knees position with CNM and L&D staff support, Theone Murdoch, husband, and Lawson Fiscal, mother, present for birth and supportive.  Delivery of a Live born female  Birth Weight: 7 lb 6 oz (3345 g) APGAR: 9, 9  Newborn Delivery   Birth date/time: 01/30/2020 04:24:00 Delivery type: Vaginal, Spontaneous      in cephalic presentation, position OA to ROA.  APGAR:1 min-9 , 5 min-9   Nuchal Cord: No  Cord double clamped after cessation of pulsation, cut by FOB.  Collection of cord blood for typing completed. Cord blood donation-None  Arterial cord blood sample-No    Placenta delivered-Spontaneous  with 3 vessels . Uterotonics: Pitocin  Placenta to patient. Uterine tone firm bleeding stable  2nd degree;Perineal;Labial  laceration identified.  Episiotomy:None  Local analgesia: none  Repair: 2-0 and 3-0 Vicryl in the usual fashion with excellent hemostasis Est. Blood Loss (mL):250.00   Complications: None  Mom to postpartum. Baby Titus to Couplet care / Skin to Skin.  Delivery Report:   Review the Delivery Report for details.    Signed: Clancy Gourd, MSN 01/30/2020, 6:30 AM

## 2019-05-30 ENCOUNTER — Emergency Department
Admission: EM | Admit: 2019-05-30 | Discharge: 2019-05-30 | Disposition: A | Discharge status: Home / Self Care | Payer: BLUE CROSS/BLUE SHIELD | Attending: Emergency Medicine | Admitting: Emergency Medicine

## 2019-05-30 ENCOUNTER — Encounter: Payer: Self-pay | Admitting: Intensive Care

## 2019-05-30 ENCOUNTER — Emergency Department: Payer: BLUE CROSS/BLUE SHIELD

## 2019-05-30 ENCOUNTER — Other Ambulatory Visit: Payer: Self-pay

## 2019-05-30 DIAGNOSIS — O99411 Diseases of the circulatory system complicating pregnancy, first trimester: Secondary | ICD-10-CM | POA: Insufficient documentation

## 2019-05-30 DIAGNOSIS — I456 Pre-excitation syndrome: Secondary | ICD-10-CM | POA: Diagnosis not present

## 2019-05-30 DIAGNOSIS — O99891 Other specified diseases and conditions complicating pregnancy: Secondary | ICD-10-CM | POA: Insufficient documentation

## 2019-05-30 DIAGNOSIS — Z3491 Encounter for supervision of normal pregnancy, unspecified, first trimester: Secondary | ICD-10-CM

## 2019-05-30 DIAGNOSIS — Z3A01 Less than 8 weeks gestation of pregnancy: Secondary | ICD-10-CM | POA: Diagnosis not present

## 2019-05-30 DIAGNOSIS — R1032 Left lower quadrant pain: Secondary | ICD-10-CM | POA: Diagnosis not present

## 2019-05-30 DIAGNOSIS — Z3490 Encounter for supervision of normal pregnancy, unspecified, unspecified trimester: Secondary | ICD-10-CM

## 2019-05-30 DIAGNOSIS — Z79899 Other long term (current) drug therapy: Secondary | ICD-10-CM | POA: Diagnosis not present

## 2019-05-30 DIAGNOSIS — R10824 Left lower quadrant rebound abdominal tenderness: Secondary | ICD-10-CM

## 2019-05-30 LAB — URINALYSIS, COMPLETE (UACMP) WITH MICROSCOPIC
Bilirubin Urine: NEGATIVE
Glucose, UA: NEGATIVE mg/dL
Hgb urine dipstick: NEGATIVE
Ketones, ur: NEGATIVE mg/dL
Nitrite: NEGATIVE
Protein, ur: 30 mg/dL — AB
Specific Gravity, Urine: 1.025 (ref 1.005–1.030)
pH: 5 (ref 5.0–8.0)

## 2019-05-30 LAB — COMPREHENSIVE METABOLIC PANEL
ALT: 12 U/L (ref 0–44)
AST: 17 U/L (ref 15–41)
Albumin: 4.8 g/dL (ref 3.5–5.0)
Alkaline Phosphatase: 38 U/L (ref 38–126)
Anion gap: 7 (ref 5–15)
BUN: 13 mg/dL (ref 6–20)
CO2: 26 mmol/L (ref 22–32)
Calcium: 9.3 mg/dL (ref 8.9–10.3)
Chloride: 107 mmol/L (ref 98–111)
Creatinine, Ser: 0.81 mg/dL (ref 0.44–1.00)
GFR calc Af Amer: >60 mL/min (ref >60)
GFR calc non Af Amer: >60 mL/min (ref >60)
Glucose, Bld: 120 mg/dL — ABNORMAL HIGH (ref 70–99)
Potassium: 3.9 mmol/L (ref 3.5–5.1)
Sodium: 140 mmol/L (ref 135–145)
Total Bilirubin: 2.6 mg/dL — ABNORMAL HIGH (ref 0.3–1.2)
Total Protein: 7.3 g/dL (ref 6.5–8.1)

## 2019-05-30 LAB — CBC
HCT: 42.1 % (ref 36.0–46.0)
Hemoglobin: 13.9 g/dL (ref 12.0–15.0)
MCH: 30.4 pg (ref 26.0–34.0)
MCHC: 33 g/dL (ref 30.0–36.0)
MCV: 92.1 fL (ref 80.0–100.0)
Platelets: 203 10*3/uL (ref 150–400)
RBC: 4.57 MIL/uL (ref 3.87–5.11)
RDW: 11.9 % (ref 11.5–15.5)
WBC: 5.1 10*3/uL (ref 4.0–10.5)
nRBC: 0 % (ref 0.0–0.2)

## 2019-05-30 LAB — POCT PREGNANCY, URINE: Preg Test, Ur: POSITIVE — AB

## 2019-05-30 LAB — LIPASE, BLOOD: Lipase: 30 U/L (ref 11–51)

## 2019-05-30 LAB — HCG, QUANTITATIVE, PREGNANCY: hCG, Beta Chain, Quant, S: 458 m[IU]/mL — ABNORMAL HIGH (ref <5)

## 2019-05-30 NOTE — Discharge Instructions (Addendum)
Your test today did not reveal a specific cause of your pain, but were generally reassuring.  Your pregnancy test was positive with a blood hCG level of 450.  From past records we know that your blood type is O+.  Please follow-up with your doctor for ongoing prenatal care, and take a daily prenatal vitamin.  Return to the emergency department if you develop vaginal bleeding or worsening abdominal pain, or other new concerns.

## 2019-05-30 NOTE — Progress Notes (Addendum)
Have reviewed the ultrasound images and labs.  G1P0 @ unknown gestational age.   She has a fairly regular 38-42 day cycle. LMP was Dec 29. She was having severe left sided stabbing pain and was sent to the ED for rule out kidney stone/ovarian torsion/ectopic pregnancy.   Beta HCG = 458 Blood type: O+ Ultrasound shows no identifiable gestational sac, but also no abnormal cystic structures within either adnexa. Corpus luteum is identified on the right. There is a thick developing echogenic  layer within the uterus, consistent with early decidual layer.   There is also a nice view of all the stool within the bowel, which could explain patient's pain.  If she doesn't have a BM in the next day, should take some Mirilax to have a BM.  Pregnancy of unknown location and duration. Return on Monday to [facility] to have beta HCG drawn  Based on doubling time and results, will do ultrasound to confirm IUP when beta >3000.  I have communicated this with the patient. Precautions for ectopic given.  ----- Ranae Plumber, MD, FACOG Attending Obstetrician and Gynecologist [facility], Department of OB/GYN [facility]

## 2019-05-30 NOTE — ED Provider Notes (Signed)
[facility] Emergency Department Provider Note  ____________________________________________  Time seen: Approximately 3:56 PM  I have reviewed the triage vital signs and the nursing notes.   HISTORY  Chief Complaint Abdominal Pain    HPI Danisha Brassfield is a 24 y.o. female with a history of GERD and WPW who comes the ED complaining of left lower quadrant colicky cramping/sharp pain for the past 4 or 5 days, intermittent, no aggravating or alleviating factors.  No vaginal bleeding or discharge.  No dysuria frequency urgency.  No vomiting or diarrhea.  Nonradiating.      Past Medical History:  Diagnosis Date  . Arrhythmia    Tachycardia  . GERD (gastroesophageal reflux disease)   . IC (interstitial cystitis)   . Wolff-Parkinson-White (WPW) syndrome      Patient Active Problem List   Diagnosis Date Noted  . Nonallopathic lesion of rib cage 08/21/2018  . Nonallopathic lesion of sacral region 04/22/2018  . Vitamin D deficiency 12/18/2017  . Interstitial cystitis 08/04/2015  . Pelvic floor dysfunction 08/04/2015  . Dysmenorrhea 05/24/2015  . Preop testing 05/24/2015  . Vestibulitis, vulvar 05/24/2015  . Dysuria 11/26/2014  . Renal colic 07/03/2014  . WPW (Wolff-Parkinson-White syndrome) 03/18/2014  . Hip flexor tightness 01/20/2014  . 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 Nonallopathic lesion of lumbosacral region 01/20/2014  . Nonallopathic lesion of thoracic region 01/20/2014  . Nonallopathic lesion of cervical region 01/20/2014  . Dyspepsia 04/07/2013  . Tachycardia, unspecified 10/22/2012  . Constipation 10/10/2012  . History of anal fissures 10/10/2012  . Lactose intolerance 03/23/2011  . Vasovagal syncope 03/23/2011     Past Surgical History:  Procedure Laterality Date  . CYSTOSCOPY  2017  . WISDOM TOOTH EXTRACTION  2012     Prior to Admission medications   Medication Sig Start Date End Date Taking? Authorizing Provider  naproxen  (NAPROSYN) 500 MG tablet Take 1 tablet (500 mg total) by mouth 2 (two) times daily with a meal. As needed for pain 12/14/17   Shambley, Melody N, CNM  norethindrone (MICRONOR,CAMILA,ERRIN) 0.35 MG tablet Take 1 tablet (0.35 mg total) by mouth daily. 03/13/18   Shambley, Melody N, CNM  Vitamin D, Ergocalciferol, (DRISDOL) 1.25 MG (50000 UT) CAPS capsule Take 1 capsule (50,000 Units total) by mouth every 7 (seven) days. 03/13/18   Purcell Nails, CNM     Allergies Patient has no known allergies.   History reviewed. No pertinent family history.  Social History Social History   Tobacco Use  . Smoking status: Never Smoker  . Smokeless tobacco: Never Used  Substance Use Topics  . Alcohol use: No  . Drug use: No    Review of Systems  Constitutional:   No fever or chills.  ENT:   No sore throat. No rhinorrhea. Cardiovascular:   No chest pain or syncope. Respiratory:   No dyspnea or cough. Gastrointestinal:   Positive as above for left lower quadrant abdominal pain without vomiting and diarrhea.  Musculoskeletal:   Negative for focal pain or swelling All other systems reviewed and are negative except as documented above in ROS and HPI.  ____________________________________________   PHYSICAL EXAM:  VITAL SIGNS: ED Triage Vitals  Enc Vitals Group     BP 05/30/19 1334 (!) 119/50     Pulse Rate 05/30/19 1334 (!) 111     Resp 05/30/19 1334 14     Temp 05/30/19 1334 99.1 F (37.3 C)     Temp Source 05/30/19 1334 Oral  SpO2 05/30/19 1334 100 %     Weight 05/30/19 1335 140 lb (63.5 kg)     Height 05/30/19 1335 5\' 6"  (1.676 m)     Head Circumference --      Peak Flow --      Pain Score 05/30/19 1335 0     Pain Loc --      Pain Edu? --      Excl. in GC? --     Vital signs reviewed, nursing assessments reviewed.   Constitutional:   Alert and oriented. Non-toxic appearance. Eyes:   Conjunctivae are normal. EOMI.  ENT      Head:   Normocephalic and atraumatic.       Nose:   Wearing a mask.      Mouth/Throat:   Wearing a mask.      Neck:   No meningismus. Full ROM. Cardiovascular:   RRR. Symmetric bilateral radial and DP pulses.   Respiratory:   Normal respiratory effort without tachypnea/retractions.  Gastrointestinal:   Soft with left lower quadrant tenderness. Non distended. There is no CVA tenderness.  No rebound, rigidity, or guarding. Musculoskeletal:   Normal range of motion in all extremities. No joint effusions.  No lower extremity tenderness.  No edema. Neurologic:   Normal speech and language.  Motor grossly intact. No acute focal neurologic deficits are appreciated.  Skin:    Skin is warm, dry and intact. No rash noted.  No petechiae, purpura, or bullae.  ____________________________________________    LABS (pertinent positives/negatives) (all labs ordered are listed, but only abnormal results are displayed) Labs Reviewed  COMPREHENSIVE METABOLIC PANEL - Abnormal; Notable for the following components:      Result Value   Glucose, Bld 120 (*)    Total Bilirubin 2.6 (*)    All other components within normal limits  URINALYSIS, COMPLETE (UACMP) WITH MICROSCOPIC - Abnormal; Notable for the following components:   Color, Urine YELLOW (*)    APPearance HAZY (*)    Protein, ur 30 (*)    Leukocytes,Ua SMALL (*)    Bacteria, UA FEW (*)    All other components within normal limits  HCG, QUANTITATIVE, PREGNANCY - Abnormal; Notable for the following components:   hCG, Beta Chain, Quant, S 458 (*)    All other components within normal limits  POCT PREGNANCY, URINE - Abnormal; Notable for the following components:   Preg Test, Ur POSITIVE (*)    All other components within normal limits  LIPASE, BLOOD  CBC  POC URINE PREG, ED   ____________________________________________   EKG    ____________________________________________    RADIOLOGY  07/28/19 OB LESS THAN 14 WEEKS W/ OB TRANSVAGINAL AND DOPPLER  Result Date: 05/30/2019 CLINICAL  DATA:  Left lower quadrant abdominal pain. Positive beta HCG EXAM: OBSTETRIC <14 WK 07/28/2019 AND TRANSVAGINAL OB US DOPPLER ULTRASOUND OF OVARIES TECHNIQUE: Both transabdominal and transvaginal ultrasound examinations were performed for complete evaluation of the gestation as well as the maternal uterus, adnexal regions, and pelvic cul-de-sac. Transvaginal technique was performed to assess early pregnancy. Color and duplex Doppler ultrasound was utilized to evaluate blood flow to the ovaries. COMPARISON:  12/14/2017 FINDINGS: Intrauterine gestational sac: None Yolk sac:  Not Visualized. Embryo:  Not Visualized. Cardiac Activity: Not Visualized. Subchorionic hemorrhage:  None visualized. Maternal uterus/adnexae: Anteverted uterus measuring 7.0 x 4.4 x 6.3 cm. Endometrium is thickened measuring 2.1 cm without intrauterine gestational sac. Right ovary measures 4.5 x 2.3 x 2.6 cm, volume 14 mL. Right ovarian cyst with  thick echogenic rim measuring 1.9 cm, likely corpus luteal cyst. Left ovary measures 3.2 x 1.8 x 2.6 cm, volume 8 mL. Left ovary is normal in appearance. No adnexal masses identified. Pulsed Doppler evaluation of both ovaries demonstrates normal appearing low-resistance arterial and venous waveforms. Trace amount of free fluid within the pelvis. IMPRESSION: 1. Thickened endometrium with no intrauterine gestational sac visualized at this time. Recommend close clinical follow-up, short interval repeat exam, and serial beta HCG levels. 2. Probable right ovarian corpus luteal cyst. 3. Negative for adnexal torsion or adnexal mass. 4. Trace free fluid within the pelvis, which may be physiologic. Electronically Signed   By: Davina Poke D.O.   On: 05/30/2019 16:59    ____________________________________________   PROCEDURES Procedures  ____________________________________________    CLINICAL IMPRESSION / ASSESSMENT AND PLAN / ED COURSE  Medications ordered in the ED: Medications - No data to  display  Pertinent labs & imaging results that were available during my care of the patient were reviewed by me and considered in my medical decision making (see chart for details).  Leighla Chestnutt was evaluated in Emergency Department on 05/30/2019 for the symptoms described in the history of present illness. She was evaluated in the context of the global COVID-19 pandemic, which necessitated consideration that the patient might be at risk for infection with the SARS-CoV-2 virus that causes COVID-19. Institutional protocols and algorithms that pertain to the evaluation of patients at risk for COVID-19 are in a state of rapid change based on information released by regulatory bodies including the CDC and federal and state organizations. These policies and algorithms were followed during the patient's care in the ED.   Patient presents with left lower quadrant tenderness.  Vital signs unremarkable except for mild tachycardia.  She is well-appearing, nontoxic.  Labs are all unremarkable except for positive pregnancy test with serum hCG of 450.  Suspect ovarian cyst, will obtain pelvic ultrasound.  Doubt ectopic.  ----------------------------------------- 5:26 PM on 05/30/2019 -----------------------------------------  Ultrasound unremarkable, no ovarian cysts.  Nonvisualization of pregnancy which is expected at only 2-[redacted] weeks gestational age.  Pain appears to be due to early pregnancy physiologic effects.  Counseled on warning signs for ectopic pregnancy and ED return precautions, otherwise continue prenatal care with her primary care doctor.      ____________________________________________   FINAL CLINICAL IMPRESSION(S) / ED DIAGNOSES    Final diagnoses:  Acute left lower quadrant pain  First trimester pregnancy     ED Discharge Orders    None      Portions of this note were generated with dragon dictation software. Dictation errors may occur despite best attempts at proofreading.    Carrie Mew, MD 05/30/19 [phone]

## 2019-05-30 NOTE — ED Triage Notes (Signed)
Patient c/o LLQ pain since Monday. Has been trying to get pregnant. Reports she is suppose to start period tomorrow. Denies N/V/D

## 2019-08-19 LAB — OB RESULTS CONSOLE GC/CHLAMYDIA
Chlamydia: NEGATIVE
Gonorrhea: NEGATIVE

## 2019-08-19 LAB — OB RESULTS CONSOLE HIV ANTIBODY (ROUTINE TESTING): HIV: NONREACTIVE

## 2019-08-19 LAB — OB RESULTS CONSOLE RPR: RPR: NONREACTIVE

## 2019-08-19 LAB — OB RESULTS CONSOLE RUBELLA ANTIBODY, IGM: Rubella: IMMUNE

## 2019-08-19 LAB — OB RESULTS CONSOLE HEPATITIS B SURFACE ANTIGEN: Hepatitis B Surface Ag: NEGATIVE

## 2019-09-29 ENCOUNTER — Ambulatory Visit: Payer: BC Managed Care – PPO

## 2019-09-29 ENCOUNTER — Ambulatory Visit: Payer: BC Managed Care – PPO | Admitting: Cardiology

## 2019-09-29 ENCOUNTER — Ambulatory Visit (INDEPENDENT_AMBULATORY_CARE_PROVIDER_SITE_OTHER): Payer: BC Managed Care – PPO

## 2019-09-29 ENCOUNTER — Other Ambulatory Visit: Payer: Self-pay

## 2019-09-29 ENCOUNTER — Encounter: Payer: Self-pay | Admitting: Cardiology

## 2019-09-29 VITALS — BP 108/80 | HR 111 | Ht 66.0 in | Wt 156.2 lb

## 2019-09-29 DIAGNOSIS — I456 Pre-excitation syndrome: Secondary | ICD-10-CM

## 2019-09-29 DIAGNOSIS — R0602 Shortness of breath: Secondary | ICD-10-CM

## 2019-09-29 DIAGNOSIS — R000 Tachycardia, unspecified: Secondary | ICD-10-CM

## 2019-09-29 NOTE — Progress Notes (Signed)
Cardiology Office Note:    Date:  09/29/2019   ID:  Katherine Schultz, DOB 1995-08-14, MRN [id number]  PCP:  Joylene Igo, CNM  [facility] Cardiologist:  Kate Sable, MD  [place] Electrophysiologist:  None   Referring MD: Darliss Cheney, CNM   Chief Complaint  Patient presents with  . Other    WFW Syndrome c/o chest pain. Meds reviewed verbally with pt.   Katherine Schultz is a 24 y.o. female who is being seen today for the evaluation of chest pain at the request of Darliss Cheney, [place].  History of Present Illness:    Katherine Schultz is a 24 y.o. female with a hx of WPW, currently [redacted] weeks pregnant who presents due to tachycardia shortness of breath.  Patient was diagnosed with WPW pattern back in 2013.  She states her father also has WPW and had an ablation in his 44s.  She states having increased heart rates for some time now but has worsened since becoming pregnant.  She used to take metoprolol to help with symptoms of tachycardia.  Symptoms alcohol almost daily lasting a couple of seconds and associated with some shortness of breath and chest tightness.  She has vasovagal syncope maybe once to 2 times a year not related with palpitations.  She denies any other history of heart disease.  Past Medical History:  Diagnosis Date  . Arrhythmia    Tachycardia  . GERD (gastroesophageal reflux disease)   . IC (interstitial cystitis)   . Wolff-Parkinson-White (WPW) syndrome     Past Surgical History:  Procedure Laterality Date  . CYSTOSCOPY  2017  . WISDOM TOOTH EXTRACTION  2012    Current Medications: Current Meds  Medication Sig  . Prenatal 28-0.8 MG TABS Take by mouth daily.     Allergies:   Patient has no known allergies.   Social History   Socioeconomic History  . Marital status: Married    Spouse name: Not on file  . Number of children: Not on file  . Years of education: Not on file  . Highest education level: Not on file   Occupational History  . Not on file  Tobacco Use  . Smoking status: Never Smoker  . Smokeless tobacco: Never Used  Substance and Sexual Activity  . Alcohol use: No  . Drug use: No  . Sexual activity: Not Currently    Birth control/protection: Pill  Other Topics Concern  . Not on file  Social History Narrative  . Not on file   Social Determinants of Health   Financial Resource Strain:   . Difficulty of Paying Living Expenses:   Food Insecurity:   . Worried About Charity fundraiser in the Last Year:   . Arboriculturist in the Last Year:   Transportation Needs:   . Film/video editor (Medical):   Marland Kitchen Lack of Transportation (Non-Medical):   Physical Activity:   . Days of Exercise per Week:   . Minutes of Exercise per Session:   Stress:   . Feeling of Stress :   Social Connections:   . Frequency of Communication with Friends and Family:   . Frequency of Social Gatherings with Friends and Family:   . Attends Religious Services:   . Active Member of Clubs or Organizations:   . Attends Archivist Meetings:   Marland Kitchen Marital Status:      Family History: The patient's family history includes Yves Dill Parkinson White syndrome in  her father and maternal grandmother.  ROS:   Please see the history of present illness.     All other systems reviewed and are negative.  EKGs/Labs/Other Studies Reviewed:    The following studies were reviewed today:   EKG:  EKG is  ordered today.  The ekg ordered today demonstrates sinus tachycardia, nonspecific T wave changes.  Recent Labs: 05/30/2019: ALT 12; BUN 13; Creatinine, Ser 0.81; Hemoglobin 13.9; Platelets 203; Potassium 3.9; Sodium 140  Recent Lipid Panel No results found for: CHOL, TRIG, HDL, CHOLHDL, VLDL, LDLCALC, LDLDIRECT  Physical Exam:    VS:  BP 108/80 (BP Location: Right Arm, Patient Position: Sitting, Cuff Size: Normal)   Pulse (!) 111   Ht 5\' 6"  (1.676 m)   Wt 156 lb 4 oz (70.9 kg)   SpO2 99%   BMI 25.22 kg/m      Wt Readings from Last 3 Encounters:  09/29/19 156 lb 4 oz (70.9 kg)  05/30/19 140 lb (63.5 kg)  10/23/18 146 lb (66.2 kg)     GEN:  Well nourished, well developed in no acute distress HEENT: Normal NECK: No JVD; No carotid bruits LYMPHATICS: No lymphadenopathy CARDIAC: RRR, no murmurs, rubs, gallops RESPIRATORY:  Clear to auscultation without rales, wheezing or rhonchi  ABDOMEN: Soft, non-tender, non-distended MUSCULOSKELETAL:  No edema; No deformity  SKIN: Warm and dry NEUROLOGIC:  Alert and oriented x 3 PSYCHIATRIC:  Normal affect   ASSESSMENT:    1. Tachycardia, unspecified   2. Shortness of breath   3. Wolff-Parkinson-White (WPW) pattern    PLAN:    In order of problems listed above:  1. Patient with episodes of tachycardia/palpitations almost daily.  She has a history of WPW pattern.  EKG today in the office shows sinus tachycardia, normal PR interval.  We will place a cardiac monitor to evaluate for any significant arrhythmias due to history of WPW pattern.  Patient may have inappropriate sinus tachycardia.  May consider beta-blocker if patient is very symptomatic. 2. Patient with shortness of breath associated with tachycardia and some chest tightness.  She is [redacted] weeks pregnant.  We will get an echocardiogram to evaluate for any structural defects. 3. She has a history of WPW pattern.  Current EKG is sinus tachycardia with normal PR interval.  Will refer patient to electrophysiology for additional input.  Follow-up after echocardiogram and cardiac monitor.  This note was generated in part or whole with voice recognition software. Voice recognition is usually quite accurate but there are transcription errors that can and very often do occur. I apologize for any typographical errors that were not detected and corrected.  Medication Adjustments/Labs and Tests Ordered: Current medicines are reviewed at length with the patient today.  Concerns regarding medicines are  outlined above.  Orders Placed This Encounter  Procedures  . Ambulatory referral to Cardiac Electrophysiology  . LONG TERM MONITOR (3-14 DAYS)  . ECHOCARDIOGRAM COMPLETE   No orders of the defined types were placed in this encounter.   Patient Instructions  Medication Instructions:   Your physician recommends that you continue on your current medications as directed. Please refer to the Current Medication list given to you today.  *If you need a refill on your cardiac medications before your next appointment, please call your pharmacy*   Lab Work: None Ordered If you have labs (blood work) drawn today and your tests are completely normal, you will receive your results only by: 12/24/18 MyChart Message (if you have MyChart) OR . A paper  copy in the mail If you have any lab test that is abnormal or we need to change your treatment, we will call you to review the results.   Testing/Procedures:  1.   Your physician has requested that you have an echocardiogram. Echocardiography is a painless test that uses sound waves to create images of your heart. It provides your doctor with information about the size and shape of your heart and how well your heart's chambers and valves are working. This procedure takes approximately one hour. There are no restrictions for this procedure.  2.   Your physician has recommended that you wear a Zio monitor. This monitor is a medical device that records the heart's electrical activity. Doctors most often use these monitors to diagnose arrhythmias. Arrhythmias are problems with the speed or rhythm of the heartbeat. The monitor is a small device applied to your chest. You can wear one while you do your normal daily activities. While wearing this monitor if you have any symptoms to push the button and record what you felt. Once you have worn this monitor for the period of time provider prescribed (Usually 14 days), you will return the monitor device in the postage paid  box. Once it is returned they will download the data collected and provide [place] with a report which the provider will then review and we will call you with those results. Important tips:  1. Avoid showering during the first 24 hours of wearing the monitor. 2. Avoid excessive sweating to help maximize wear time. 3. Do not submerge the device, no hot tubs, and no swimming pools. 4. Keep any lotions or oils away from the patch. 5. After 24 hours you may shower with the patch on. Take brief showers with your back facing the shower head.  6. Do not remove patch once it has been placed because that will interrupt data and decrease adhesive wear time. 7. Push the button when you have any symptoms and write down what you were feeling. 8. Once you have completed wearing your monitor, remove and place into box which has postage paid and place in your outgoing mailbox.  9. If for some reason you have misplaced your box then call our office and we can provide another box and/or mail it off for you.         Follow-Up: At [facility], you and your health needs are our priority.  As part of our continuing mission to provide you with exceptional heart care, we have created designated Provider Care Teams.  These Care Teams include your primary Cardiologist (physician) and Advanced Practice Providers (APPs -  Physician Assistants and Nurse Practitioners) who all work together to provide you with the care you need, when you need it.  We recommend signing up for the patient portal called "MyChart".  Sign up information is provided on this After Visit Summary.  MyChart is used to connect with patients for Virtual Visits (Telemedicine).  Patients are able to view lab/test results, encounter notes, upcoming appointments, etc.  Non-urgent messages can be sent to your provider as well.   To learn more about what you can do with MyChart, go to [email or web address].    Your next appointment:   5 weeks, after Zio  and Echo results   The format for your next appointment:   In Person  Provider:    You may see Debbe Odea, MD or one of the following Advanced Practice Providers on your designated Care Team:  Other Instructions:  Referral to Dr. Graciela Husbands (Electophysiology)    Signed, Debbe Odea, MD  09/29/2019 12:47 PM     [facility]

## 2019-09-29 NOTE — Patient Instructions (Signed)
Medication Instructions:   Your physician recommends that you continue on your current medications as directed. Please refer to the Current Medication list given to you today.  *If you need a refill on your cardiac medications before your next appointment, please call your pharmacy*   Lab Work: None Ordered If you have labs (blood work) drawn today and your tests are completely normal, you will receive your results only by: Marland Kitchen MyChart Message (if you have MyChart) OR . A paper copy in the mail If you have any lab test that is abnormal or we need to change your treatment, we will call you to review the results.   Testing/Procedures:  1.   Your physician has requested that you have an echocardiogram. Echocardiography is a painless test that uses sound waves to create images of your heart. It provides your doctor with information about the size and shape of your heart and how well your heart's chambers and valves are working. This procedure takes approximately one hour. There are no restrictions for this procedure.  2.   Your physician has recommended that you wear a Zio monitor. This monitor is a medical device that records the heart's electrical activity. Doctors most often use these monitors to diagnose arrhythmias. Arrhythmias are problems with the speed or rhythm of the heartbeat. The monitor is a small device applied to your chest. You can wear one while you do your normal daily activities. While wearing this monitor if you have any symptoms to push the button and record what you felt. Once you have worn this monitor for the period of time provider prescribed (Usually 14 days), you will return the monitor device in the postage paid box. Once it is returned they will download the data collected and provide [place] with a report which the provider will then review and we will call you with those results. Important tips:  1. Avoid showering during the first 24 hours of wearing the monitor. 2. Avoid  excessive sweating to help maximize wear time. 3. Do not submerge the device, no hot tubs, and no swimming pools. 4. Keep any lotions or oils away from the patch. 5. After 24 hours you may shower with the patch on. Take brief showers with your back facing the shower head.  6. Do not remove patch once it has been placed because that will interrupt data and decrease adhesive wear time. 7. Push the button when you have any symptoms and write down what you were feeling. 8. Once you have completed wearing your monitor, remove and place into box which has postage paid and place in your outgoing mailbox.  9. If for some reason you have misplaced your box then call our office and we can provide another box and/or mail it off for you.         Follow-Up: At [facility], you and your health needs are our priority.  As part of our continuing mission to provide you with exceptional heart care, we have created designated Provider Care Teams.  These Care Teams include your primary Cardiologist (physician) and Advanced Practice Providers (APPs -  Physician Assistants and Nurse Practitioners) who all work together to provide you with the care you need, when you need it.  We recommend signing up for the patient portal called "MyChart".  Sign up information is provided on this After Visit Summary.  MyChart is used to connect with patients for Virtual Visits (Telemedicine).  Patients are able to view lab/test results, encounter notes, upcoming  appointments, etc.  Non-urgent messages can be sent to your provider as well.   To learn more about what you can do with MyChart, go to [email or web address].    Your next appointment:   5 weeks, after Zio and Echo results   The format for your next appointment:   In Person  Provider:    You may see Debbe Odea, MD or one of the following Advanced Practice Providers on your designated Care Team:     Other Instructions:  Referral to Dr. Graciela Husbands  (Electophysiology)

## 2019-11-05 ENCOUNTER — Other Ambulatory Visit: Payer: BC Managed Care – PPO

## 2019-11-06 ENCOUNTER — Ambulatory Visit: Payer: BC Managed Care – PPO | Admitting: Cardiology

## 2019-11-13 ENCOUNTER — Ambulatory Visit: Payer: BC Managed Care – PPO | Admitting: Internal Medicine

## 2019-11-17 NOTE — Addendum Note (Signed)
Addended by: Thayer Headings, Othell Diluzio L on: 11/17/2019 10:23 AM   Modules accepted: Orders

## 2019-11-25 ENCOUNTER — Other Ambulatory Visit: Payer: Self-pay | Admitting: *Deleted

## 2019-11-25 DIAGNOSIS — R000 Tachycardia, unspecified: Secondary | ICD-10-CM

## 2020-01-05 LAB — OB RESULTS CONSOLE GBS: GBS: POSITIVE

## 2020-01-27 ENCOUNTER — Encounter (HOSPITAL_COMMUNITY): Payer: Self-pay | Admitting: *Deleted

## 2020-01-27 ENCOUNTER — Telehealth (HOSPITAL_COMMUNITY): Payer: Self-pay | Admitting: *Deleted

## 2020-01-27 ENCOUNTER — Other Ambulatory Visit: Payer: Self-pay | Admitting: Certified Nurse Midwife

## 2020-01-27 NOTE — Telephone Encounter (Signed)
Preadmission screen  

## 2020-01-28 ENCOUNTER — Telehealth (HOSPITAL_COMMUNITY): Payer: Self-pay | Admitting: *Deleted

## 2020-01-28 ENCOUNTER — Other Ambulatory Visit (HOSPITAL_COMMUNITY)
Admission: RE | Admit: 2020-01-28 | Discharge: 2020-01-28 | Disposition: A | Discharge status: Home / Self Care | Payer: BC Managed Care – PPO | Source: Ambulatory Visit | Attending: Obstetrics and Gynecology | Admitting: Obstetrics and Gynecology

## 2020-01-28 LAB — SARS CORONAVIRUS 2 (TAT 6-24 HRS): SARS Coronavirus 2: NEGATIVE

## 2020-01-28 NOTE — Telephone Encounter (Signed)
Preadmission screen  

## 2020-01-29 ENCOUNTER — Other Ambulatory Visit: Payer: Self-pay

## 2020-01-29 ENCOUNTER — Inpatient Hospital Stay (HOSPITAL_COMMUNITY)
Admission: AD | Admit: 2020-01-29 | Discharge: 2020-02-01 | DRG: 807 | Disposition: A | Discharge status: Home / Self Care | Payer: BC Managed Care – PPO | Attending: Obstetrics | Admitting: Obstetrics

## 2020-01-29 ENCOUNTER — Inpatient Hospital Stay (HOSPITAL_COMMUNITY): Payer: BC Managed Care – PPO

## 2020-01-29 ENCOUNTER — Encounter (HOSPITAL_COMMUNITY): Payer: Self-pay | Admitting: Certified Nurse Midwife

## 2020-01-29 DIAGNOSIS — O9089 Other complications of the puerperium, not elsewhere classified: Secondary | ICD-10-CM | POA: Diagnosis not present

## 2020-01-29 DIAGNOSIS — O9943 Diseases of the circulatory system complicating the puerperium: Secondary | ICD-10-CM | POA: Diagnosis not present

## 2020-01-29 DIAGNOSIS — O99824 Streptococcus B carrier state complicating childbirth: Secondary | ICD-10-CM | POA: Diagnosis present

## 2020-01-29 DIAGNOSIS — I456 Pre-excitation syndrome: Secondary | ICD-10-CM | POA: Diagnosis present

## 2020-01-29 DIAGNOSIS — J40 Bronchitis, not specified as acute or chronic: Secondary | ICD-10-CM | POA: Diagnosis not present

## 2020-01-29 DIAGNOSIS — O9942 Diseases of the circulatory system complicating childbirth: Principal | ICD-10-CM | POA: Diagnosis present

## 2020-01-29 DIAGNOSIS — Z3490 Encounter for supervision of normal pregnancy, unspecified, unspecified trimester: Secondary | ICD-10-CM | POA: Diagnosis present

## 2020-01-29 DIAGNOSIS — R000 Tachycardia, unspecified: Secondary | ICD-10-CM | POA: Diagnosis present

## 2020-01-29 DIAGNOSIS — O9902 Anemia complicating childbirth: Secondary | ICD-10-CM

## 2020-01-29 DIAGNOSIS — Z3A39 39 weeks gestation of pregnancy: Secondary | ICD-10-CM | POA: Diagnosis not present

## 2020-01-29 DIAGNOSIS — O9953 Diseases of the respiratory system complicating the puerperium: Secondary | ICD-10-CM | POA: Diagnosis not present

## 2020-01-29 LAB — TYPE AND SCREEN
ABO/RH(D): O POS
Antibody Screen: NEGATIVE

## 2020-01-29 LAB — CBC
HCT: 35.5 % — ABNORMAL LOW (ref 36.0–46.0)
Hemoglobin: 11.5 g/dL — ABNORMAL LOW (ref 12.0–15.0)
MCH: 29.7 pg (ref 26.0–34.0)
MCHC: 32.4 g/dL (ref 30.0–36.0)
MCV: 91.7 fL (ref 80.0–100.0)
Platelets: 214 10*3/uL (ref 150–400)
RBC: 3.87 MIL/uL (ref 3.87–5.11)
RDW: 13.4 % (ref 11.5–15.5)
WBC: 9.8 10*3/uL (ref 4.0–10.5)
nRBC: 0 % (ref 0.0–0.2)

## 2020-01-29 MED ORDER — SODIUM CHLORIDE 0.9 % IV SOLN: 250.0000 mL | INTRAVENOUS | Status: DC | PRN

## 2020-01-29 MED ORDER — OXYTOCIN-SODIUM CHLORIDE 30-0.9 UT/500ML-% IV SOLN
2.5000 [IU]/h | INTRAVENOUS | Status: DC
  Administered 2020-01-29: 2.5 [IU]/h via INTRAVENOUS
  Filled 2020-01-29: qty 500

## 2020-01-29 MED ORDER — SODIUM CHLORIDE 0.9 % IV SOLN: 5.0000 10*6.[IU] | Freq: Once | INTRAVENOUS | Status: DC

## 2020-01-29 MED ORDER — MISOPROSTOL 25 MCG QUARTER TABLET
25.0000 ug | ORAL_TABLET | ORAL | Status: DC | PRN
  Administered 2020-01-29 (×3): 25 ug via BUCCAL
  Filled 2020-01-29 (×3): qty 1

## 2020-01-29 MED ORDER — FENTANYL CITRATE (PF) 100 MCG/2ML IJ SOLN: 50.0000 ug | INTRAMUSCULAR | Status: DC | PRN

## 2020-01-29 MED ORDER — TERBUTALINE SULFATE 1 MG/ML IJ SOLN: 0.2500 mg | Freq: Once | INTRAMUSCULAR | Status: DC | PRN

## 2020-01-29 MED ORDER — ACETAMINOPHEN 500 MG PO TABS: 1000.0000 mg | ORAL_TABLET | Freq: Four times a day (QID) | ORAL | Status: DC | PRN

## 2020-01-29 MED ORDER — ONDANSETRON HCL 4 MG/2ML IJ SOLN
4.0000 mg | Freq: Four times a day (QID) | INTRAMUSCULAR | Status: DC | PRN
  Administered 2020-01-29: 4 mg via INTRAVENOUS
  Filled 2020-01-29: qty 2

## 2020-01-29 MED ORDER — SOD CITRATE-CITRIC ACID 500-334 MG/5ML PO SOLN: 30.0000 mL | ORAL | Status: DC | PRN

## 2020-01-29 MED ORDER — SODIUM CHLORIDE 0.9% FLUSH: 3.0000 mL | INTRAVENOUS | Status: DC | PRN

## 2020-01-29 MED ORDER — LIDOCAINE HCL (PF) 1 % IJ SOLN: 30.0000 mL | INTRAMUSCULAR | Status: DC | PRN

## 2020-01-29 MED ORDER — OXYTOCIN 10 UNIT/ML IJ SOLN: 10.0000 [IU] | Freq: Once | INTRAMUSCULAR | Status: DC

## 2020-01-29 MED ORDER — LACTATED RINGERS IV SOLN: 500.0000 mL | INTRAVENOUS | Status: DC | PRN

## 2020-01-29 MED ORDER — PENICILLIN G POT IN DEXTROSE 60000 UNIT/ML IV SOLN: 3.0000 10*6.[IU] | INTRAVENOUS | Status: DC

## 2020-01-29 MED ORDER — OXYTOCIN BOLUS FROM INFUSION
333.0000 mL | Freq: Once | INTRAVENOUS | Status: AC
  Administered 2020-01-30: 333 mL via INTRAVENOUS

## 2020-01-29 MED ORDER — SODIUM CHLORIDE 0.9% FLUSH: 3.0000 mL | Freq: Two times a day (BID) | INTRAVENOUS | Status: DC

## 2020-01-29 NOTE — H&P (Signed)
OB ADMISSION/ HISTORY & PHYSICAL:  Admission Date: 01/29/2020 12:33 PM  Admit Diagnosis: Encounter for induction of labor [Z34.90]    Katherine Schultz is a 24 y.o. female presenting for IOL due to [facility]- Parkinson White syndrome. States irregular contractions. Denies leaking of fluid or vaginal bleeding. Endorses positive fetal movement. Husband, Eli, and mother at the bedside providing support. Expecting baby boy "Titus".   Prenatal History: G1P0   EDC : 02/03/2020  Prenatal care at [facility] since 16 weeks, transfer from [facility]  Prenatal course complicated by: 1. Wolff-Parkinson White syndrome, trialed metoprolol in pregnancy and discontinued due to side effects 2. GBS positive   Prenatal Labs: ABO, Rh:   O POS Antibody: NEG (10/07 1320) Negative Rubella:   Immune RPR:   Non-reactive HBsAg:   Negative HIV:   Negative GBS:   Positive 1 hr Glucola : 88 Genetic Screening: Declines Ultrasound: normal female anatomy, anterior placenta, AFI 17.6cm, EFW 2526gms, 5lb 9oz, 23%    Maternal Diabetes: No Genetic Screening: Declined Maternal Ultrasounds/Referrals: Normal Fetal Ultrasounds or other Referrals:  None Maternal Substance Abuse:  No Significant Maternal Medications:  None Significant Maternal Lab Results:  Group B Strep positive Other Comments:  None  Medical / Surgical History :  Past medical history:  Past Medical History:  Diagnosis Date  . Arrhythmia    Tachycardia  . GERD (gastroesophageal reflux disease)   . IC (interstitial cystitis)   . Wolff-Parkinson-White (WPW) syndrome     Past surgical history:  Past Surgical History:  Procedure Laterality Date  . CYSTOSCOPY  2017  . WISDOM TOOTH EXTRACTION  2012    Family History:  Family History  Problem Relation Age of Onset  . Evelene Croon Parkinson White syndrome Father   . Evelene Croon Parkinson White syndrome Maternal Grandmother     Social History:  reports that she has never smoked. She has never used  smokeless tobacco. She reports that she does not drink alcohol and does not use drugs.  Allergies: Patient has no known allergies.   Current Medications at time of admission:  Medications Prior to Admission  Medication Sig Dispense Refill Last Dose  . Prenatal 28-0.8 MG TABS Take by mouth daily.       Review of Systems: Review of Systems  All other systems reviewed and are negative.  Physical Exam: Vital signs and nursing notes reviewed.  Patient Vitals for the past 24 hrs:  BP Temp Temp src Pulse Resp Height Weight  01/29/20 1305 -- -- -- -- -- 5\' 6"  (1.676 m) 83.7 kg  01/29/20 1250 129/89 98 F (36.7 C) Oral (!) 131 18 -- --    General: AAO x 3, NAD Heart: RRR Lungs:CTAB Abdomen: Gravid, NT, Leopold's 6.5-7lbs Extremities: no edema Genitalia / VE: deferred  FHR: 145BPM, mod variability, + accels, no decels TOCO: Ctx q occasional  Labs:   Pending T&S, CBC, RPR  Recent Labs    01/29/20 1330  WBC 9.8  HGB 11.5*  HCT 35.5*  PLT 214     Assessment:  24 y.o. G1P0 at [redacted]w[redacted]d, Wolff-Parkinson White syndrome  1. IOL 2. FHR category 1 3. GBS positive 4. Desires unmedicated birth, planning waterbirth, certificate in chart 5. Planning to breastfeeding 6. Placenta disposal to patient  Plan:  1. Admit to BS 2. Routine L&D orders 3. Analgesia/anesthesia PRN  4. Cytotec [redacted]w[redacted]d buccally q 4 hours, will consider AROM after adequately treated for GBS 5. Counseled extensively on risks of + GBS with vaginal delivery. Pt  declines antibiotics for GBS prophylaxis.  6. Anticipate NSVB  Dr. Ernestina Penna notified of admission / plan of care  June Leap CNM, MSN 01/29/2020, 4:25 PM

## 2020-01-29 NOTE — Progress Notes (Signed)
S: Feeling more cramping, back pain, and vaginal pressure. Family at the bedside.   O: Vitals:   01/29/20 1305 01/29/20 1540 01/29/20 1735 01/29/20 1830  BP:  126/80 131/89 129/88  Pulse:  99 (!) 114 91  Resp:  18 18 18   Temp:  98.1 F (36.7 C)    TempSrc:  Oral    Weight: 83.7 kg     Height: 5\' 6"  (1.676 m)      FHT:  FHR: 145 bpm, variability: moderate,  accelerations:  Present,  decelerations:  Absent UC:   occassional SVE: 4-5/70-1, membrane sweep performed  A / P: Induction of labor due to , s/p 1 dose of buccal Cytotec  Fetal Wellbeing:  Category I Pain Control:  Labor support without medications Anticipated MOD:  NSVD   Pt counseled on the R/B/A of antibiotics in labor for GBS +. Pt declines antibiotics at this time.   Will give 3rd dose of buccal Cytotec at 2130. Avoid AROM due to untreated GBS.   [employer], CNM, MSN 01/29/2020, 8:04 PM

## 2020-01-29 NOTE — Progress Notes (Signed)
S: Bouncing on birth ball. Family at the bedside. State mild cramping.   O: Vitals:   01/29/20 1250 01/29/20 1305  BP: 129/89   Pulse: (!) 131   Resp: 18   Temp: 98 F (36.7 C)   TempSrc: Oral   Weight:  83.7 kg  Height:  5\' 6"  (1.676 m)   FHT:  FHR: 155 bpm, variability: moderate,  accelerations:  Present,  decelerations:  Absent UC:   occassional SVE:   Vertex verified by bedside ultrasound  A / P: Induction of labor due to , s/p 1 dose of buccal Cytotec  Fetal Wellbeing:  Category I Pain Control:  Labor support without medications Anticipated MOD:  NSVD   Will give another dose of buccal Cytotec.   Pt counseled on the R/B/A of antibiotics in labor for GBS +. Pt declines antibiotics at this time.   [employer], CNM, MSN 01/29/2020, 5:16 PM

## 2020-01-29 NOTE — Progress Notes (Signed)
Patient declined treatment for Group Betastrep. Agreed to [employer] IV access

## 2020-01-30 ENCOUNTER — Inpatient Hospital Stay (HOSPITAL_COMMUNITY): Payer: BC Managed Care – PPO | Admitting: Anesthesiology

## 2020-01-30 ENCOUNTER — Encounter (HOSPITAL_COMMUNITY): Payer: Self-pay | Admitting: Obstetrics

## 2020-01-30 LAB — RPR: RPR Ser Ql: NONREACTIVE

## 2020-01-30 MED ORDER — FENTANYL-BUPIVACAINE-NACL 0.5-0.125-0.9 MG/250ML-% EP SOLN
EPIDURAL | Status: AC
  Filled 2020-01-30: qty 250

## 2020-01-30 MED ORDER — TETANUS-DIPHTH-ACELL PERTUSSIS 5-2.5-18.5 LF-MCG/0.5 IM SUSP: 0.5000 mL | Freq: Once | INTRAMUSCULAR | Status: DC

## 2020-01-30 MED ORDER — ONDANSETRON HCL 4 MG/2ML IJ SOLN: 4.0000 mg | INTRAMUSCULAR | Status: DC | PRN

## 2020-01-30 MED ORDER — EPHEDRINE 5 MG/ML INJ: 10.0000 mg | INTRAVENOUS | Status: DC | PRN

## 2020-01-30 MED ORDER — PHENYLEPHRINE 40 MCG/ML (10ML) SYRINGE FOR IV PUSH (FOR BLOOD PRESSURE SUPPORT): 80.0000 ug | PREFILLED_SYRINGE | INTRAVENOUS | Status: DC | PRN

## 2020-01-30 MED ORDER — SIMETHICONE 80 MG PO CHEW: 80.0000 mg | CHEWABLE_TABLET | ORAL | Status: DC | PRN

## 2020-01-30 MED ORDER — LACTATED RINGERS IV SOLN: 500.0000 mL | Freq: Once | INTRAVENOUS | Status: DC

## 2020-01-30 MED ORDER — LIDOCAINE-EPINEPHRINE (PF) 2 %-1:200000 IJ SOLN
INTRAMUSCULAR | Status: DC | PRN
  Administered 2020-01-30 (×2): 3 mL via EPIDURAL

## 2020-01-30 MED ORDER — WITCH HAZEL-GLYCERIN EX PADS: 1.0000 "application " | MEDICATED_PAD | CUTANEOUS | Status: DC | PRN

## 2020-01-30 MED ORDER — DIPHENHYDRAMINE HCL 50 MG/ML IJ SOLN: 12.5000 mg | INTRAMUSCULAR | Status: DC | PRN

## 2020-01-30 MED ORDER — FENTANYL-BUPIVACAINE-NACL 0.5-0.125-0.9 MG/250ML-% EP SOLN: 12.0000 mL/h | EPIDURAL | Status: DC | PRN

## 2020-01-30 MED ORDER — ONDANSETRON HCL 4 MG PO TABS: 4.0000 mg | ORAL_TABLET | ORAL | Status: DC | PRN

## 2020-01-30 MED ORDER — BUPIVACAINE HCL (PF) 0.75 % IJ SOLN
INTRAMUSCULAR | Status: DC | PRN
Start: 2020-01-30 — End: 2020-01-30
  Administered 2020-01-30: 12 mL/h via EPIDURAL

## 2020-01-30 MED ORDER — ACETAMINOPHEN 325 MG PO TABS
650.0000 mg | ORAL_TABLET | ORAL | Status: DC | PRN
  Administered 2020-01-30 – 2020-01-31 (×2): 650 mg via ORAL
  Filled 2020-01-30 (×2): qty 2

## 2020-01-30 MED ORDER — DIBUCAINE (PERIANAL) 1 % EX OINT: 1.0000 "application " | TOPICAL_OINTMENT | CUTANEOUS | Status: DC | PRN

## 2020-01-30 MED ORDER — PRENATAL MULTIVITAMIN CH
1.0000 | ORAL_TABLET | Freq: Every day | ORAL | Status: DC
  Administered 2020-01-30 – 2020-02-01 (×3): 1 via ORAL
  Filled 2020-01-30 (×3): qty 1

## 2020-01-30 MED ORDER — SENNOSIDES-DOCUSATE SODIUM 8.6-50 MG PO TABS
2.0000 | ORAL_TABLET | ORAL | Status: DC
  Administered 2020-01-30 – 2020-01-31 (×2): 2 via ORAL
  Filled 2020-01-30 (×2): qty 2

## 2020-01-30 MED ORDER — BENZOCAINE-MENTHOL 20-0.5 % EX AERO
1.0000 "application " | INHALATION_SPRAY | CUTANEOUS | Status: DC | PRN
  Administered 2020-01-30: 1 via TOPICAL
  Filled 2020-01-30: qty 56

## 2020-01-30 MED ORDER — ZOLPIDEM TARTRATE 5 MG PO TABS: 5.0000 mg | ORAL_TABLET | Freq: Every evening | ORAL | Status: DC | PRN

## 2020-01-30 MED ORDER — DIPHENHYDRAMINE HCL 25 MG PO CAPS: 25.0000 mg | ORAL_CAPSULE | Freq: Four times a day (QID) | ORAL | Status: DC | PRN

## 2020-01-30 MED ORDER — IBUPROFEN 600 MG PO TABS
600.0000 mg | ORAL_TABLET | Freq: Four times a day (QID) | ORAL | Status: DC
  Administered 2020-01-30 – 2020-02-01 (×9): 600 mg via ORAL
  Filled 2020-01-30 (×9): qty 1

## 2020-01-30 MED ORDER — COCONUT OIL OIL: 1.0000 "application " | TOPICAL_OIL | Status: DC | PRN

## 2020-01-30 NOTE — Lactation Note (Signed)
This note was copied from a baby's chart. Lactation Consultation Note  Patient Name: Katherine Schultz [id number] Date: 01/30/2020   P1, Baby 6 hours old.  Mother hand expressed good flow bilaterally. Assisted with latching in cross cradle reverting to cradle once latch achieved. Intermittent swallows observed. Feed on demand with cues.  Goal 8-12+ times per day after first 24 hrs.  Place baby STS if not cueing.  Reviewed basics.  Mother did attend online breastfeeding group. Mom made aware of O/P services, breastfeeding support groups, community resources, and our phone # for post-discharge questions.       Maternal Data    Feeding    LATCH Score                   Interventions    Lactation Tools Discussed/Used     Consult Status      [name] [facility] 01/30/2020, 11:23 AM

## 2020-01-30 NOTE — Anesthesia Procedure Notes (Signed)
Epidural Patient location during procedure: OB Start time: 01/30/2020 1:10 AM End time: 01/30/2020 1:20 AM  Staffing Anesthesiologist: Elmer Picker, MD Performed: anesthesiologist   Preanesthetic Checklist Completed: patient identified, IV checked, risks and benefits discussed, monitors and equipment checked, pre-op evaluation and timeout performed  Epidural Patient position: sitting Prep: DuraPrep and site prepped and draped Patient monitoring: continuous pulse ox, blood pressure, heart rate and cardiac monitor Approach: midline Location: L3-L4 Injection technique: LOR air  Needle:  Needle type: Tuohy  Needle gauge: 17 G Needle length: 9 cm Needle insertion depth: 5 cm Catheter type: closed end flexible Catheter size: 19 Gauge Catheter at skin depth: 11 cm Test dose: negative  Assessment Sensory level: T8 Events: blood not aspirated, injection not painful, no injection resistance, no paresthesia and negative IV test  Additional Notes Patient identified. Risks/Benefits/Options discussed with patient including but not limited to bleeding, infection, nerve damage, paralysis, failed block, incomplete pain control, headache, blood pressure changes, nausea, vomiting, reactions to medication both or allergic, itching and postpartum back pain. Confirmed with bedside nurse the patient's most recent platelet count. Confirmed with patient that they are not currently taking any anticoagulation, have any bleeding history or any family history of bleeding disorders. Patient expressed understanding and wished to proceed. All questions were answered. Sterile technique was used throughout the entire procedure. Please see nursing notes for vital signs. Test dose was given through epidural catheter and negative prior to continuing to dose epidural or start infusion. Warning signs of high block given to the patient including shortness of breath, tingling/numbness in hands, complete motor block,  or any concerning symptoms with instructions to call for help. Patient was given instructions on fall risk and not to get out of bed. All questions and concerns addressed with instructions to call with any issues or inadequate analgesia.  Reason for block:procedure for pain

## 2020-01-30 NOTE — Progress Notes (Signed)
S: Comfortable with epidural. Discussed the R/B/A of AROM and IUPC. Pt consents to procedure. Discussed GBS positive status and recommendation for antibiotics for prophylaxis.   O: Vitals:   01/30/20 0136 01/30/20 0139 01/30/20 0140 01/30/20 0145  BP:  111/80  117/81  Pulse:  76  90  Resp:  16  16  Temp:      TempSrc:      SpO2: 99% 98% 98%   Weight:      Height:       FHT:  FHR: 140 bpm, variability: moderate,  accelerations:  Present,  decelerations:  Present occasional variables and early decelerations UC:  1.5-2 minutes SVE: 5/90/-1  AROM of a large amount of clear fluid  IUPC placed without difficulty  A / P: Induction of labor due to [employer], s/p 3 doses of buccal Cytotec  Fetal Wellbeing:  Category I Pain Control:  Epidural Anticipated MOD:  NSVD   Pt counseled on the R/B/A of antibiotics in labor for GBS +. Pt declines antibiotics at this time.   Will titrate Pitocin to adequate MVUs.   Recheck SVE in 3-4 hours or earlier if necessary.  June Leap, CNM, MSN 01/30/2020, 2:21 AM

## 2020-01-30 NOTE — Anesthesia Postprocedure Evaluation (Signed)
Anesthesia Post Note  Patient: Katherine Schultz  Procedure(s) Performed: AN AD HOC LABOR EPIDURAL     Patient location during evaluation: Mother Baby Anesthesia Type: Epidural Level of consciousness: awake and alert Pain management: pain level controlled Vital Signs Assessment: post-procedure vital signs reviewed and stable Respiratory status: spontaneous breathing, nonlabored ventilation and respiratory function stable Cardiovascular status: stable Postop Assessment: no headache, no backache and epidural receding Anesthetic complications: no   No complications documented.  Last Vitals:  Vitals:   01/30/20 0800 01/30/20 0900  BP: 124/83 107/74  Pulse:  (!) 106  Resp: 18 18  Temp: 37.1 C   SpO2: 99% 98%    Last Pain:  Vitals:   01/30/20 0800  TempSrc: Oral  PainSc:    Pain Goal: Patients Stated Pain Goal: 5 (01/30/20 0139)                 Fanny Dance

## 2020-01-30 NOTE — Anesthesia Preprocedure Evaluation (Signed)
Anesthesia Evaluation  Patient identified by MRN, date of birth, ID band Patient awake    Reviewed: Allergy & Precautions, NPO status , Patient's Chart, lab work & pertinent test results  Airway Mallampati: II  TM Distance: >3 FB Neck ROM: Full    Dental no notable dental hx.    Pulmonary neg pulmonary ROS,    Pulmonary exam normal breath sounds clear to auscultation       Cardiovascular Normal cardiovascular exam+ dysrhythmias (WPW)  Rhythm:Regular Rate:Normal     Neuro/Psych negative neurological ROS  negative psych ROS   GI/Hepatic Neg liver ROS, GERD  ,  Endo/Other  negative endocrine ROS  Renal/GU negative Renal ROS  negative genitourinary   Musculoskeletal negative musculoskeletal ROS (+)   Abdominal   Peds  Hematology negative hematology ROS (+)   Anesthesia Other Findings IOL for WPW (not currently on medications)  Reproductive/Obstetrics (+) Pregnancy                             Anesthesia Physical Anesthesia Plan  ASA: III  Anesthesia Plan: Epidural   Post-op Pain Management:    Induction:   PONV Risk Score and Plan: Treatment may vary due to age or medical condition  Airway Management Planned: Natural Airway  Additional Equipment:   Intra-op Plan:   Post-operative Plan:   Informed Consent: I have reviewed the patients History and Physical, chart, labs and discussed the procedure including the risks, benefits and alternatives for the proposed anesthesia with the patient or authorized representative who has indicated his/her understanding and acceptance.       Plan Discussed with: Anesthesiologist  Anesthesia Plan Comments: (Patient identified. Risks, benefits, options discussed with patient including but not limited to bleeding, infection, nerve damage, paralysis, failed block, incomplete pain control, headache, blood pressure changes, nausea, vomiting,  reactions to medication, itching, and post partum back pain. Confirmed with bedside nurse the patient's most recent platelet count. Confirmed with the patient that they are not taking any anticoagulation, have any bleeding history or any family history of bleeding disorders. Patient expressed understanding and wishes to proceed. All questions were answered. )        Anesthesia Quick Evaluation

## 2020-01-31 DIAGNOSIS — O9902 Anemia complicating childbirth: Secondary | ICD-10-CM

## 2020-01-31 LAB — CBC
HCT: 28.5 % — ABNORMAL LOW (ref 36.0–46.0)
Hemoglobin: 9.2 g/dL — ABNORMAL LOW (ref 12.0–15.0)
MCH: 30 pg (ref 26.0–34.0)
MCHC: 32.3 g/dL (ref 30.0–36.0)
MCV: 92.8 fL (ref 80.0–100.0)
Platelets: 158 10*3/uL (ref 150–400)
RBC: 3.07 MIL/uL — ABNORMAL LOW (ref 3.87–5.11)
RDW: 13.8 % (ref 11.5–15.5)
WBC: 12.3 10*3/uL — ABNORMAL HIGH (ref 4.0–10.5)
nRBC: 0 % (ref 0.0–0.2)

## 2020-01-31 MED ORDER — MAGNESIUM OXIDE 400 (241.3 MG) MG PO TABS
400.0000 mg | ORAL_TABLET | Freq: Every day | ORAL | Status: DC
  Administered 2020-01-31 – 2020-02-01 (×2): 400 mg via ORAL
  Filled 2020-01-31 (×2): qty 1

## 2020-01-31 MED ORDER — POLYSACCHARIDE IRON COMPLEX 150 MG PO CAPS
150.0000 mg | ORAL_CAPSULE | Freq: Every day | ORAL | Status: DC
  Administered 2020-01-31 – 2020-02-01 (×2): 150 mg via ORAL
  Filled 2020-01-31 (×2): qty 1

## 2020-01-31 NOTE — Progress Notes (Signed)
PPD # 1 S/P NSVD  Live born female  Birth Weight: 7 lb 6 oz (3345 g) APGAR: 9, 9  Newborn Delivery   Birth date/time: 01/30/2022 04:24:00 Delivery type: Vaginal, Spontaneous     Baby name: Katherine Schultz Delivering provider: Dorisann Frames K   Episiotomy:None   Lacerations:2nd degree;Perineal;Labial   Circumcision: Declined  Feeding: breast  Pain control at delivery: Epidural   Subjective   Reports feeling well but sore. Breastfeeding going well. Trying to sleep when baby sleeps. Husband, Eli, at the bedside.              Tolerating po/ No nausea or vomiting             Bleeding is moderate             Pain controlled with acetaminophen and ibuprofen (OTC)             Up ad lib / ambulatory / voiding without difficulties   Objective   A & O x 3, in no apparent distress              VS:  Vitals:   01/30/20 1330 01/30/20 1725 01/30/20 2152 01/31/20 0500  BP: (!) 123/92 123/84 114/76 106/73  Pulse: (!) 114 (!) 111 100 (!) 108  Resp: 18 18 18 18   Temp: 98.5 F (36.9 C) 98 F (36.7 C) 98.5 F (36.9 C) 98.1 F (36.7 C)  TempSrc: Oral Oral Oral Oral  SpO2: 100% 100% 100% 100%  Weight:      Height:        LABS:  Recent Labs    01/29/20 1330 01/31/20 0426  WBC 9.8 12.3*  HGB 11.5* 9.2*  HCT 35.5* 28.5*  PLT 214 158    Blood type: --/--/O POS (10/07 1320)  Rubella: Immune (04/27 0000)   Vaccines:   TDaP   Declined                   Flu       Will offer at discharge                             COVID-19 Declined   Gen: AAO x 3, NAD  Abdomen: soft, non-tender, non-distended             Fundus: firm, non-tender, U-1  Perineum: repair intact, no edema  Lochia: moderate  Extremities: no edema, no calf pain or tenderness   Assessment/Plan PPD # 1 24 y.o., G1P1001   Principal Problem:   Postpartum care following vaginal delivery 10/8 Active Problems:   WPW (Wolff-Parkinson-White syndrome)  HR stable in low 100s, no medications   SVD (spontaneous vaginal delivery)  10/8  Doing well - stable status  Routine post partum orders   Second degree perineal laceration  Encouraged the use of the peri bottle and Dermoplast spray. Discussed sitz baths after discharge.    Maternal anemia, with delivery  Asymptomatic, started Niferex and mag oxide  Anticipate discharge tomorrow.   12/8, MSN, CNM 01/31/2020, 7:26 AM

## 2020-01-31 NOTE — Progress Notes (Signed)
Patient states her nipples feel raw.  Patient has been using coconut oil and expressed breast milk, RN gave patient comfort gels and shells.  RN educated patient to use cold comfort gels after a feeding and to not combine them with coconut oil.  RN told patient to use coconut oil and shells once the comfort gels have come to room temperature and she has put them back in fridge.  Patient voiced understanding.  Myisha Pickerel, [place]

## 2020-01-31 NOTE — Lactation Note (Signed)
This note was copied from a baby's chart. Lactation Consultation Note  Patient Name: Katherine Schultz [id number] Date: 01/31/2020 Reason for consult: Mother's request;Follow-up assessment;Nipple pain/trauma  LC to room at RN request. Mom with slight nipple soreness. Assisted with positioning/latch and chin pull to deepen latch. Mom comfortable and without pain during feeding. Answered all questions.    Maternal Data    Feeding Feeding Type: Breast Fed  LATCH Score Latch: Grasps breast easily, tongue down, lips flanged, rhythmical sucking.  Audible Swallowing: Spontaneous and intermittent  Type of Nipple: Everted at rest and after stimulation  Comfort (Breast/Nipple): Filling, red/small blisters or bruises, mild/mod discomfort  Hold (Positioning): Assistance needed to correctly position infant at breast and maintain latch.  LATCH Score: 8  Interventions Interventions: Breast feeding basics reviewed;Assisted with latch;Skin to skin;Adjust position;Coconut oil;Position options  Lactation Tools Discussed/Used Tools: Coconut oil   Consult Status Consult Status: Follow-up Date: 02/01/20 Follow-up type: In-patient    Katherine Schultz 01/31/2020, 2:20 PM

## 2020-02-01 ENCOUNTER — Other Ambulatory Visit: Payer: Self-pay

## 2020-02-01 ENCOUNTER — Inpatient Hospital Stay (HOSPITAL_COMMUNITY)
Admission: AD | Admit: 2020-02-01 | Discharge: 2020-02-02 | Disposition: A | Discharge status: Home / Self Care | Payer: BC Managed Care – PPO | Attending: Obstetrics and Gynecology | Admitting: Obstetrics and Gynecology

## 2020-02-01 DIAGNOSIS — O9953 Diseases of the respiratory system complicating the puerperium: Secondary | ICD-10-CM | POA: Diagnosis not present

## 2020-02-01 DIAGNOSIS — O9943 Diseases of the circulatory system complicating the puerperium: Secondary | ICD-10-CM

## 2020-02-01 DIAGNOSIS — O9089 Other complications of the puerperium, not elsewhere classified: Secondary | ICD-10-CM | POA: Diagnosis not present

## 2020-02-01 DIAGNOSIS — I456 Pre-excitation syndrome: Secondary | ICD-10-CM | POA: Insufficient documentation

## 2020-02-01 DIAGNOSIS — J40 Bronchitis, not specified as acute or chronic: Secondary | ICD-10-CM | POA: Diagnosis not present

## 2020-02-01 MED ORDER — MAGNESIUM OXIDE 400 (241.3 MG) MG PO TABS: 400.0000 mg | ORAL_TABLET | Freq: Every day | ORAL | 1 refills | Status: DC

## 2020-02-01 MED ORDER — BENZOCAINE-MENTHOL 20-0.5 % EX AERO: 1.0000 "application " | INHALATION_SPRAY | CUTANEOUS | 1 refills | Status: DC | PRN

## 2020-02-01 MED ORDER — POLYSACCHARIDE IRON COMPLEX 150 MG PO CAPS: 150.0000 mg | ORAL_CAPSULE | Freq: Every day | ORAL | 1 refills | Status: DC

## 2020-02-01 MED ORDER — IBUPROFEN 600 MG PO TABS
600.0000 mg | ORAL_TABLET | Freq: Four times a day (QID) | ORAL | 0 refills | Status: DC
Start: 2020-02-01 — End: 2022-03-21

## 2020-02-01 MED ORDER — ACETAMINOPHEN 325 MG PO TABS
650.0000 mg | ORAL_TABLET | ORAL | 1 refills | Status: DC | PRN
Start: 2020-02-01 — End: 2022-03-21

## 2020-02-01 MED ORDER — COCONUT OIL OIL: 1.0000 "application " | TOPICAL_OIL | 0 refills | Status: DC | PRN

## 2020-02-01 NOTE — Discharge Instructions (Signed)
Katherine Schultz with [employer] for lactation support

## 2020-02-01 NOTE — MAU Note (Signed)
Pt is 2 day PP following a vaginal delivery. Pt reports since delivery she has been feeling an increase in her heart rate, light headed and dizzy with minimal exertion, also wheezing, or "bubbling in her chest" She felt it was result of trauma and changes from delivery. But feels that these symptoms are not improving and are worsening in severity. She was instructed to come to hospital for evaluation by CNM on call for her practice.

## 2020-02-01 NOTE — Lactation Note (Signed)
This note was copied from a baby's chart. Lactation Consultation Note  Patient Name: Katherine Schultz [id number] Date: 02/01/2020 Reason for consult: Follow-up assessment Mother is a P92, infant is 72 hours old and is now at 7 % wt loss.   Mother reports that infant is feeding well. Mother reports that her nipples are raw from clluster breastfeedfeedfeeding through the night.   Reviewed hand expression with mother. Observed large drops of colostrum. Mother was given a harmony hand pump with instructions. Mothers nipples are erect with compressible breast tissue. No observed trama of mothers nipples.  Reviewed S/S of Mastitis .   Mother was observed with infant latched on at the left breast. Observed infant suckling with audible swallows. Infant sustained latch fo 10 mins.   Discussed treatment and prevention of engorgement.   Plan of Care : Breastfeed infant with feeding cues Supplement infant with ebm/formula, according to supplemental guidelines. Pump using a DEBP after each feeding for 15-20 mins.   Mother to continue to cue base feed infant and feed at least 8-12 times or more in 24 hours and advised to allow for cluster feeding infant as needed.  Mother to continue to due STS. Mother is aware of available LC services at [facility] BFSG'S, OP Dept, and phone # for questions or concerns about breastfeeding.  Mother receptive to all teaching and plan of care.    Maternal Data    Feeding Feeding Type: Breast Fed  LATCH Score Latch: Grasps breast easily, tongue down, lips flanged, rhythmical sucking.  Audible Swallowing: Spontaneous and intermittent  Type of Nipple: Everted at rest and after stimulation  Comfort (Breast/Nipple): Filling, red/small blisters or bruises, mild/mod discomfort  Hold (Positioning): No assistance needed to correctly position infant at breast.  LATCH Score: 9  Interventions    Lactation Tools Discussed/Used     Consult Status      Michel Bickers 02/01/2020, 9:38 AM

## 2020-02-01 NOTE — Discharge Summary (Signed)
OB Discharge Summary  Patient Name: Katherine Schultz DOB: 08-15-1995 MRN: [id number]  Date of admission: 01/29/2020 Delivering provider: Dorisann Frames K   Admitting diagnosis: Encounter for induction of labor [Z34.90] Intrauterine pregnancy: [redacted]w[redacted]d     Secondary diagnosis: Patient Active Problem List   Diagnosis Date Noted  . Maternal anemia, with delivery 01/31/2020  . SVD (spontaneous vaginal delivery) 10/8 01/30/2020  . Second degree perineal laceration 01/30/2020  . Postpartum care following vaginal delivery 10/8 01/30/2020  . Encounter for induction of labor 01/29/2020  . WPW (Wolff-Parkinson-White syndrome) 03/18/2014  . Tachycardia, unspecified 10/22/2012    Date of discharge: 02/01/2020   Discharge diagnosis: Principal Problem:   Postpartum care following vaginal delivery 10/8 Active Problems:   Tachycardia, unspecified   WPW (Wolff-Parkinson-White syndrome)   Encounter for induction of labor   SVD (spontaneous vaginal delivery) 10/8   Second degree perineal laceration   Maternal anemia, with delivery                                                       Post partum procedures: None  Augmentation: AROM and Cytotec Pain control: Epidural  Laceration:2nd degree;Perineal;Labial  Episiotomy:None  Complications: None  Hospital course:  Induction of Labor With Vaginal Delivery   24 y.o. yo G1P1001 at [redacted]w[redacted]d was admitted to the hospital 01/29/2020 for induction of labor.  Indication for induction: Wolff-Parkinson White syndrome.  Patient had an uncomplicated labor course as follows: Membrane Rupture Time/Date: 2:07 AM ,01/30/2020   Delivery Method:Vaginal, Spontaneous  Episiotomy: None  Lacerations:  2nd degree;Perineal;Labial  Details of delivery can be found in separate delivery note.  Patient had a postpartum course complicated by maternal anemia and was started on PO Niferex and mag oxide. She was prescribed PO Ferex at discharge. Patient is discharged home  02/01/20.  Newborn Data: Birth date:01/30/2020  Birth time:4:24 AM  Gender:Female  Living status:Living  Apgars:9 ,9  Weight:3345 g   Physical exam  Vitals:   01/31/20 0500 01/31/20 1549 01/31/20 1919 02/01/20 0533  BP: 106/73 113/78 121/79 124/90  Pulse: (!) 108 (!) 108 100 87  Resp: 18 18 18 18   Temp: 98.1 F (36.7 C) 98.2 F (36.8 C) 98.3 F (36.8 C) 98 F (36.7 C)  TempSrc: Oral Oral Oral Oral  SpO2: 100% 99% 98% 100%  Weight:      Height:       General: alert, cooperative and no distress Lochia: appropriate Uterine Fundus: firm Perineum: repair intact, edematous labia bilaterally DVT Evaluation: No evidence of DVT seen on physical exam. No significant calf/ankle edema. Labs: Lab Results  Component Value Date   WBC 12.3 (H) 01/31/2020   HGB 9.2 (L) 01/31/2020   HCT 28.5 (L) 01/31/2020   MCV 92.8 01/31/2020   PLT 158 01/31/2020   CMP Latest Ref Rng & Units 05/30/2019  Glucose 70 - 99 mg/dL 07/28/2019)  BUN 6 - 20 mg/dL 13  Creatinine 846(N - 6.29 mg/dL 5.28  Sodium 4.13 - 244 mmol/L 140  Potassium 3.5 - 5.1 mmol/L 3.9  Chloride 98 - 111 mmol/L 107  CO2 22 - 32 mmol/L 26  Calcium 8.9 - 10.3 mg/dL 9.3  Total Protein 6.5 - 8.1 g/dL 7.3  Total Bilirubin 0.3 - 1.2 mg/dL 2.6(H)  Alkaline Phos 38 - 126 U/L 38  AST 15 - 41 U/L  17  ALT 0 - 44 U/L 12   Edinburgh Postnatal Depression Scale Screening Tool 01/30/2020  I have been able to laugh and see the funny side of things. 0  I have looked forward with enjoyment to things. 0  I have blamed myself unnecessarily when things went wrong. 0  I have been anxious or worried for no good reason. 0  I have felt scared or panicky for no good reason. 0  Things have been getting on top of me. 0  I have been so unhappy that I have had difficulty sleeping. 0  I have felt sad or miserable. 0  I have been so unhappy that I have been crying. 0  The thought of harming myself has occurred to me. 0  Edinburgh Postnatal Depression Scale  Total 0   Vaccines: TDaP Declined         Flu    Declined         COVID-19   Declined  Discharge instructions:  per After Visit Summary and [facility] booklet  After Visit Meds:  Allergies as of 02/01/2020   No Known Allergies     Medication List    TAKE these medications   acetaminophen 325 MG tablet Commonly known as: Tylenol Take 2 tablets (650 mg total) by mouth every 4 (four) hours as needed (for pain scale < 4).   benzocaine-Menthol 20-0.5 % Aero Commonly known as: DERMOPLAST Apply 1 application topically as needed for irritation (perineal discomfort).   coconut oil Oil Apply 1 application topically as needed.   ibuprofen 600 MG tablet Commonly known as: ADVIL Take 1 tablet (600 mg total) by mouth every 6 (six) hours.   iron polysaccharides 150 MG capsule Commonly known as: Ferrex 150 Take 1 capsule (150 mg total) by mouth daily.   magnesium oxide 400 (241.3 Mg) MG tablet Commonly known as: MAG-OX Take 1 tablet (400 mg total) by mouth daily.   Prenatal 28-0.8 MG Tabs Take by mouth daily.            Discharge Care Instructions  (From admission, onward)         Start     Ordered   02/01/20 0000  Discharge wound care:       Comments: Sitz baths 2 times /day with warm water x 1 week. May add herbals: 1 ounce dried comfrey leaf* 1 ounce calendula flowers 1 ounce lavender flowers  Supplies can be found online at [employer] sources at [employer], [employer]  1/2 ounce dried uva ursi leaves 1/2 ounce witch hazel blossoms (if you can find them) 1/2 ounce dried sage leaf 1/2 cup sea salt Directions: Bring 2 quarts of water to a boil. Turn off heat, and place 1 ounce (approximately 1 large handful) of the above mixed herbs (not the salt) into the pot. Steep, covered, for 30 minutes.  Strain the liquid well with a fine mesh strainer, and discard the herb material. Add 2 quarts of liquid to the tub, along with the 1/2 cup of salt. This  medicinal liquid can also be made into compresses and peri-rinses.   02/01/20 1032         Diet: routine diet  Activity: Advance as tolerated. Pelvic rest for 6 weeks.   Newborn Data: Live born female  Birth Weight: 7 lb 6 oz (3345 g) APGAR: 9, 9  Newborn Delivery   Birth date/time: 01/30/2020 04:24:00 Delivery type: Vaginal, Spontaneous    Named Kinnie Scales Baby  Feeding: Breast Disposition:home with mother  Delivery Report:  Review the Delivery Report for details.    Follow up:  Follow-up Information    June Leap, CNM. Schedule an appointment as soon as possible for a visit in 2 week(s).   Specialty: Certified Nurse Midwife Why: Please keep your appointment in 2 weeks with Dorisann Frames CNM.  Contact information: [address] [place] [address] [phone]              June Leap, CNM, MSN 02/01/2020, 10:33 AM

## 2020-02-02 ENCOUNTER — Other Ambulatory Visit (HOSPITAL_COMMUNITY): Payer: BC Managed Care – PPO

## 2020-02-02 ENCOUNTER — Inpatient Hospital Stay (HOSPITAL_COMMUNITY): Payer: BC Managed Care – PPO

## 2020-02-02 NOTE — Discharge Instructions (Signed)

## 2020-02-02 NOTE — MAU Provider Note (Addendum)
History     CSN: [id number]  Arrival date and time: 02/01/20 2216   Chief Complaint  Patient presents with  . Wheezing  . Tachycardia    with light activity   Katherine Schultz is a 24 y.o. G1P1 who is 2 days PP from SVD on 01/30/20. She presents to MAU with complaints of wheezing and tachycardia. Patient reports that since delivery she has been getting lightheaded, dizzy and mildly tachycardic with exertion. She reports that she has presenting this to her provider prior to discharge and had CBC completed - Hgb 9.2 yesterday. Patient also reports that she has a "bubbling in chest" that sounds like wheezing and a new onset of cough. She has a hx of WPW syndrome and was told by CNM to come in for evaluation. She denies dizziness or lightheadedness at this time.    OB History    Gravida  1   Para  1   Term  1   Preterm      AB      Living  1     SAB      TAB      Ectopic      Multiple  0   Live Births  1           Past Medical History:  Diagnosis Date  . Arrhythmia    Tachycardia  . GERD (gastroesophageal reflux disease)   . IC (interstitial cystitis)   . Wolff-Parkinson-White (WPW) syndrome     Past Surgical History:  Procedure Laterality Date  . CYSTOSCOPY  2017  . WISDOM TOOTH EXTRACTION  2012    Family History  Problem Relation Age of Onset  . Evelene Croon Parkinson White syndrome Father   . Evelene Croon Parkinson White syndrome Maternal Grandmother     Social History   Tobacco Use  . Smoking status: Never Smoker  . Smokeless tobacco: Never Used  Vaping Use  . Vaping Use: Never used  Substance Use Topics  . Alcohol use: No  . Drug use: No    Allergies: No Known Allergies  Medications Prior to Admission  Medication Sig Dispense Refill Last Dose  . acetaminophen (TYLENOL) 325 MG tablet Take 2 tablets (650 mg total) by mouth every 4 (four) hours as needed (for pain scale < 4). 30 tablet 1 02/01/2020 at 1100  . benzocaine-Menthol (DERMOPLAST)  20-0.5 % AERO Apply 1 application topically as needed for irritation (perineal discomfort). 56 g 1 02/01/2020 at Unknown time  . coconut oil OIL Apply 1 application topically as needed.  0 02/01/2020 at Unknown time  . ibuprofen (ADVIL) 600 MG tablet Take 1 tablet (600 mg total) by mouth every 6 (six) hours. 30 tablet 0 02/01/2020  . iron polysaccharides (FERREX 150) 150 MG capsule Take 1 capsule (150 mg total) by mouth daily. 30 capsule 1 02/01/2020 at Unknown time  . Prenatal 28-0.8 MG TABS Take by mouth daily.   02/01/2020 at Unknown time  . magnesium oxide (MAG-OX) 400 (241.3 Mg) MG tablet Take 1 tablet (400 mg total) by mouth daily. 30 tablet 1     Review of Systems  Constitutional: Negative.   Respiratory: Positive for cough and wheezing.   Cardiovascular:       Heart racing  Gastrointestinal: Negative.   Genitourinary: Negative.   Musculoskeletal: Negative.   Neurological: Negative for syncope and weakness.  Psychiatric/Behavioral: Negative.    Physical Exam   Blood pressure 118/80, pulse 98, temperature 98.2 F (36.8 C), temperature source Oral,  resp. rate 18, height 5\' 6"  (1.676 m), weight 82.1 kg, SpO2 100 %, unknown if currently breastfeeding.  Physical Exam HENT:     Head: Normocephalic.  Cardiovascular:     Rate and Rhythm: Normal rate and regular rhythm.  Pulmonary:     Effort: Pulmonary effort is normal. No respiratory distress.     Breath sounds: Normal breath sounds. No wheezing.  Abdominal:     General: There is no distension.     Palpations: Abdomen is soft. There is no mass.     Tenderness: There is no abdominal tenderness. There is no guarding.  Skin:    General: Skin is warm and dry.     Coloration: Skin is not pale.  Neurological:     Mental Status: She is alert and oriented to person, place, and time.  Psychiatric:        Mood and Affect: Mood normal.        Behavior: Behavior normal.        Thought Content: Thought content normal.     MAU  Course  Procedures  MDM Orders Placed This Encounter  Procedures  . DG Chest 2 View  . ED EKG   EKG - NSR Chest X Ray reviewed:  DG Chest 2 View  Result Date: 02/02/2020 CLINICAL DATA:  Dizziness, lightheaded, wheezing and tachycardia EXAM: CHEST - 2 VIEW COMPARISON:  None. FINDINGS: Mild airways thickening. No consolidation, features of edema, pneumothorax, or effusion. Pulmonary vascularity is normally distributed. The cardiomediastinal contours are unremarkable. No acute osseous or soft tissue abnormality. IMPRESSION: Mild airways thickening compatible with bronchitis or reactive airways disease. Electronically Signed   By: 04/03/2020 M.D.   On: 02/02/2020 00:15   Consult with Dr 04/03/2020 on assessment and management- okay with discharge home and no other lab work.  Discussed with patient results of EKG and Chest Xray, discussed with patient diagnoses of bronchitis which is does not need antibiotics. Encouraged patient to hydrate, rest and take it easy over the next 2-3 days. Discussed with patient normal physiology process of fluid shift postpartum.   Discussed reasons to return to MAU. Follow up as scheduled in the office for postpartum care. Return to MAU as needed. Pt stable at time of discharge.   Assessment and Plan   1. Bronchitis   2. Postpartum care and examination   3. Wolff-Parkinson-White (WPW) syndrome    Discharge home Follow up as scheduled in the office for postpartum care Return to MAU as needed for reasons discussed and/or emergencies  Continue taking medication as prescribed including iron supplementation    Follow-up Information    [facility] Follow up.   Contact information: [address] [place] [phone]              Allergies as of 02/02/2020   No Known Allergies     Medication List    TAKE these medications   acetaminophen 325 MG tablet Commonly known as: Tylenol Take 2 tablets (650 mg total) by mouth every 4  (four) hours as needed (for pain scale < 4).   benzocaine-Menthol 20-0.5 % Aero Commonly known as: DERMOPLAST Apply 1 application topically as needed for irritation (perineal discomfort).   coconut oil Oil Apply 1 application topically as needed.   ibuprofen 600 MG tablet Commonly known as: ADVIL Take 1 tablet (600 mg total) by mouth every 6 (six) hours.   iron polysaccharides 150 MG capsule Commonly known as: Ferrex 150 Take 1 capsule (150 mg  total) by mouth daily.   magnesium oxide 400 (241.3 Mg) MG tablet Commonly known as: MAG-OX Take 1 tablet (400 mg total) by mouth daily.   Prenatal 28-0.8 MG Tabs Take by mouth daily.       Sharyon Cable CNM 02/02/2020, 1:02 AM

## 2020-02-04 ENCOUNTER — Inpatient Hospital Stay (HOSPITAL_COMMUNITY)
Admission: AD | Admit: 2020-02-04 | Discharge: 2020-02-05 | Disposition: A | Discharge status: Home / Self Care | Payer: BC Managed Care – PPO | Attending: Obstetrics and Gynecology | Admitting: Obstetrics and Gynecology

## 2020-02-04 ENCOUNTER — Other Ambulatory Visit: Payer: Self-pay

## 2020-02-04 DIAGNOSIS — K219 Gastro-esophageal reflux disease without esophagitis: Secondary | ICD-10-CM | POA: Insufficient documentation

## 2020-02-04 DIAGNOSIS — R600 Localized edema: Secondary | ICD-10-CM | POA: Insufficient documentation

## 2020-02-04 DIAGNOSIS — O1205 Gestational edema, complicating the puerperium: Secondary | ICD-10-CM | POA: Diagnosis not present

## 2020-02-04 DIAGNOSIS — I456 Pre-excitation syndrome: Secondary | ICD-10-CM | POA: Insufficient documentation

## 2020-02-04 DIAGNOSIS — O9089 Other complications of the puerperium, not elsewhere classified: Secondary | ICD-10-CM | POA: Insufficient documentation

## 2020-02-04 DIAGNOSIS — M7989 Other specified soft tissue disorders: Secondary | ICD-10-CM

## 2020-02-04 NOTE — MAU Note (Signed)
Had SVD 10/8. IOL for elevated b/p. This afternoon Lfoot and leg has been swollen. Crampy feeling behind L thigh and calf. More uncomfortable than anything. Leg is not painful but tight.

## 2020-02-05 ENCOUNTER — Ambulatory Visit (HOSPITAL_BASED_OUTPATIENT_CLINIC_OR_DEPARTMENT_OTHER)
Admission: RE | Admit: 2020-02-05 | Discharge: 2020-02-05 | Disposition: A | Discharge status: Home / Self Care | Payer: BC Managed Care – PPO | Source: Ambulatory Visit | Attending: Certified Nurse Midwife | Admitting: Certified Nurse Midwife

## 2020-02-05 ENCOUNTER — Other Ambulatory Visit: Payer: Self-pay

## 2020-02-05 ENCOUNTER — Encounter (HOSPITAL_COMMUNITY): Payer: Self-pay | Admitting: Obstetrics and Gynecology

## 2020-02-05 ENCOUNTER — Other Ambulatory Visit (HOSPITAL_COMMUNITY): Payer: Self-pay | Admitting: Certified Nurse Midwife

## 2020-02-05 DIAGNOSIS — M79662 Pain in left lower leg: Secondary | ICD-10-CM

## 2020-02-05 DIAGNOSIS — M7989 Other specified soft tissue disorders: Secondary | ICD-10-CM | POA: Insufficient documentation

## 2020-02-05 DIAGNOSIS — O1205 Gestational edema, complicating the puerperium: Secondary | ICD-10-CM

## 2020-02-05 MED ORDER — ENOXAPARIN SODIUM 80 MG/0.8ML ~~LOC~~ SOLN
1.0000 mg/kg | Freq: Once | SUBCUTANEOUS | Status: AC
  Administered 2020-02-05: 80 mg via SUBCUTANEOUS
  Filled 2020-02-05: qty 0.8

## 2020-02-05 NOTE — Progress Notes (Addendum)
Wynelle Bourgeois CNM in earlier to discuss plan of care. Pt to go to [facility] at 0800 for u/s of leg. Written and verbal d/c instructions given and understanding voiced.

## 2020-02-05 NOTE — Discharge Instructions (Signed)

## 2020-02-05 NOTE — MAU Provider Note (Signed)
Chief Complaint:  No chief complaint on file.   First Provider Initiated Contact with Patient 02/05/20 0115      HPI: Katherine Schultz is a 24 y.o. G1P1001 who presents to maternity admissions reporting swelling in left foot and leg with some cramping noted in thigh and calf.  No strong pain behind knee.  Delivered 6 days ago. . She reports vaginal bleeding, vaginal itching/burning, urinary symptoms, h/a, dizziness, n/v, or fever/chills.    Other This is a new problem. The current episode started today. The problem occurs constantly. The problem has been unchanged. Pertinent negatives include no abdominal pain, chest pain, chills, congestion, fatigue, fever, headaches, myalgias or numbness. The symptoms are aggravated by standing. She has tried position changes for the symptoms.   RN Note: Had SVD 10/8. IOL for elevated b/p. This afternoon Lfoot and leg has been swollen. Crampy feeling behind L thigh and calf. More uncomfortable than anything. Leg is not painful but tight.    Past Medical History: Past Medical History:  Diagnosis Date  . Arrhythmia    Tachycardia  . GERD (gastroesophageal reflux disease)   . IC (interstitial cystitis)   . Wolff-Parkinson-White (WPW) syndrome     Past obstetric history: OB History  Gravida Para Term Preterm AB Living  1 1 1     1   SAB TAB Ectopic Multiple Live Births        0 1    # Outcome Date GA Lbr Len/2nd Weight Sex Delivery Anes PTL Lv  1 Term 01/30/20 [redacted]w[redacted]d 01:53 / 00:24 3345 g M Vag-Spont EPI  LIV     Birth Comments: WNL    Past Surgical History: Past Surgical History:  Procedure Laterality Date  . CYSTOSCOPY  2017  . WISDOM TOOTH EXTRACTION  2012    Family History: Family History  Problem Relation Age of Onset  . 2013 Parkinson White syndrome Father   . Evelene Croon Parkinson White syndrome Maternal Grandmother     Social History: Social History   Tobacco Use  . Smoking status: Never Smoker  . Smokeless tobacco: Never  Used  Vaping Use  . Vaping Use: Never used  Substance Use Topics  . Alcohol use: No  . Drug use: No    Allergies: No Known Allergies  Meds:  Medications Prior to Admission  Medication Sig Dispense Refill Last Dose  . benzocaine-Menthol (DERMOPLAST) 20-0.5 % AERO Apply 1 application topically as needed for irritation (perineal discomfort). 56 g 1 02/04/2020 at Unknown time  . coconut oil OIL Apply 1 application topically as needed.  0 Past Week at Unknown time  . ferrous sulfate 325 (65 FE) MG EC tablet Take 325 mg by mouth 3 (three) times daily with meals.   02/04/2020 at Unknown time  . ibuprofen (ADVIL) 600 MG tablet Take 1 tablet (600 mg total) by mouth every 6 (six) hours. 30 tablet 0 02/04/2020 at Unknown time  . magnesium oxide (MAG-OX) 400 (241.3 Mg) MG tablet Take 1 tablet (400 mg total) by mouth daily. 30 tablet 1 02/04/2020 at Unknown time  . Prenatal 28-0.8 MG TABS Take by mouth daily.   02/04/2020 at Unknown time  . acetaminophen (TYLENOL) 325 MG tablet Take 2 tablets (650 mg total) by mouth every 4 (four) hours as needed (for pain scale < 4). 30 tablet 1   . iron polysaccharides (FERREX 150) 150 MG capsule Take 1 capsule (150 mg total) by mouth daily. 30 capsule 1     I have reviewed patient's  Past Medical Hx, Surgical Hx, Family Hx, Social Hx, medications and allergies.  ROS:  Review of Systems  Constitutional: Negative for chills, fatigue and fever.  HENT: Negative for congestion.   Cardiovascular: Negative for chest pain.  Gastrointestinal: Negative for abdominal pain.  Musculoskeletal: Negative for myalgias.  Neurological: Negative for numbness and headaches.   Other systems negative     Physical Exam   Patient Vitals for the past 24 hrs:  BP Temp Pulse Resp SpO2 Height Weight  02/04/20 2018 115/84 98.8 F (37.1 C) (!) 103 16 100 % 5\' 6"  (1.676 m) 78.9 kg   Constitutional: Well-developed, well-nourished female in no acute distress.  Cardiovascular:  normal rate and rhythm Respiratory: normal effort, no distress.  No dyspnea or chest pain GI: Abd soft, non-tender.  Nondistended.  No rebound, No guarding.  MS: Extremities with edema of left ankle and foot more pronounced than on right.  Negative Homan's sign bilaterally.  No erethema.  Neurologic: Alert and oriented x 4.   Grossly nonfocal. GU: Neg CVAT. Skin:  Warm and Dry Psych:  Affect appropriate.  PELVIC EXAM: deferred   Labs: No results found for this or any previous visit (from the past 24 hour(s)). --/--/O POS (10/07 1320)  Imaging:    MAU Course/MDM: I have ordered labs as follows:  none Imaging ordered: Left lower extremity doppler study for tomorrow morning .   Treatments in MAU included Lovenox SQ 1mg /kg x 1.   Pt stable at time of discharge.  Assessment: Postpartum Day #6 Left lower leg edema and discomfort Negative Homans sign  Plan: Discharge home Recommend return in AM to Admissions at 0800 for Doppler Study (ordered) Lovenox given   Encouraged to return here or to other Urgent Care/ED if she develops worsening of symptoms, increase in pain, fever, or other concerning symptoms.   01-15-2000 CNM, MSN Certified Nurse-Midwife 02/05/2020 1:15 AM

## 2020-02-05 NOTE — Progress Notes (Signed)
VASCULAR LAB    Left lower extremity venous duplex has been performed.  See CV proc for preliminary results.  Messaged Dorisann Frames, CNM.  Attempted to call [phone], but no answer and no available voicemail.   Jazzalynn Rhudy, RVT 02/05/2020, 1:09 PM

## 2020-07-02 ENCOUNTER — Other Ambulatory Visit: Payer: Self-pay | Admitting: Internal Medicine

## 2020-07-02 DIAGNOSIS — H538 Other visual disturbances: Secondary | ICD-10-CM

## 2020-07-02 DIAGNOSIS — G44201 Tension-type headache, unspecified, intractable: Secondary | ICD-10-CM

## 2020-07-02 DIAGNOSIS — R42 Dizziness and giddiness: Secondary | ICD-10-CM

## 2020-07-05 ENCOUNTER — Other Ambulatory Visit: Payer: Self-pay

## 2020-07-05 ENCOUNTER — Ambulatory Visit
Admission: RE | Admit: 2020-07-05 | Discharge: 2020-07-05 | Disposition: A | Discharge status: Home / Self Care | Payer: Managed Care, Other (non HMO) | Source: Ambulatory Visit | Attending: Internal Medicine | Admitting: Internal Medicine

## 2020-07-05 DIAGNOSIS — G44201 Tension-type headache, unspecified, intractable: Secondary | ICD-10-CM | POA: Diagnosis present

## 2020-07-05 DIAGNOSIS — R42 Dizziness and giddiness: Secondary | ICD-10-CM | POA: Diagnosis present

## 2020-07-05 DIAGNOSIS — H538 Other visual disturbances: Secondary | ICD-10-CM | POA: Diagnosis present

## 2021-12-02 LAB — OB RESULTS CONSOLE RUBELLA ANTIBODY, IGM: Rubella: IMMUNE

## 2021-12-02 LAB — OB RESULTS CONSOLE HIV ANTIBODY (ROUTINE TESTING): HIV: NONREACTIVE

## 2021-12-02 LAB — OB RESULTS CONSOLE HEPATITIS B SURFACE ANTIGEN: Hepatitis B Surface Ag: NEGATIVE

## 2022-03-19 ENCOUNTER — Encounter (HOSPITAL_COMMUNITY): Payer: Self-pay | Admitting: Obstetrics and Gynecology

## 2022-03-19 ENCOUNTER — Inpatient Hospital Stay (HOSPITAL_COMMUNITY)
Admission: AD | Admit: 2022-03-19 | Discharge: 2022-03-21 | Disposition: A | Discharge status: Home / Self Care | Payer: Managed Care, Other (non HMO) | Source: Home / Self Care | Attending: Obstetrics and Gynecology | Admitting: Obstetrics and Gynecology

## 2022-03-19 DIAGNOSIS — I456 Pre-excitation syndrome: Secondary | ICD-10-CM | POA: Diagnosis present

## 2022-03-19 DIAGNOSIS — O4411 Placenta previa with hemorrhage, first trimester: Secondary | ICD-10-CM | POA: Diagnosis not present

## 2022-03-19 DIAGNOSIS — O4412 Placenta previa with hemorrhage, second trimester: Secondary | ICD-10-CM | POA: Diagnosis present

## 2022-03-19 DIAGNOSIS — N939 Abnormal uterine and vaginal bleeding, unspecified: Secondary | ICD-10-CM | POA: Diagnosis present

## 2022-03-19 DIAGNOSIS — O4692 Antepartum hemorrhage, unspecified, second trimester: Secondary | ICD-10-CM | POA: Diagnosis not present

## 2022-03-19 DIAGNOSIS — Z3A26 26 weeks gestation of pregnancy: Secondary | ICD-10-CM | POA: Diagnosis not present

## 2022-03-19 DIAGNOSIS — O99412 Diseases of the circulatory system complicating pregnancy, second trimester: Secondary | ICD-10-CM | POA: Diagnosis present

## 2022-03-19 NOTE — MAU Note (Signed)
.  Katherine Schultz is a 26 y.o. at Unknown here in MAU reporting: "light cramping and red vaginal bleeding" Pt states she has been diagnosed with complete placenta previa. Reports + fetal movement.  Pt on pelvic rest. Did have sexual encounter last night without penetration.    Reports over the last week she has had continuous uterine tightening  Onset of complaint: 1840 Pain score: 2 light cramping lower abdomen And lower back pain Vitals:   03/19/22 2112  BP: 127/76  Pulse: (!) 111  Resp: 17  Temp: 98 F (36.7 C)  SpO2: 99%     FHT:144bpm Lab orders placed from triage:

## 2022-03-19 NOTE — MAU Provider Note (Signed)
History     CSN: [id number]  Arrival date and time: 03/19/22 2041   Event Date/Time   First Provider Initiated Contact with Patient 03/19/22 2153      Chief Complaint  Patient presents with   Vaginal Bleeding   placenta previa   Katherine Schultz , a  26 y.o. G2P1001 at [redacted]w[redacted]d presents to MAU with complaints of vaginal bleeding and lower abdominal cramping. Patient states Wednesday patient was on her feet for multiple hours and started having low back pain starting Wednesday and leading into Thursday. She endorses Light cramping and low back pain today rating pain a 2/10. Today In the grocery store she thought it was discharge, but realized it was bright red blood with small clots in her underwear starting at 7pm. She Endorses positive movement. Patient states she has a known complete previa. Patient denies previous episodes of bleeding prior to this.  Last night, patient endorses orgasms last night but denies penetration.     OB History     Gravida  2   Para  1   Term  1   Preterm      AB      Living  1      SAB      IAB      Ectopic      Multiple  0   Live Births  1           Past Medical History:  Diagnosis Date   Arrhythmia    Tachycardia   GERD (gastroesophageal reflux disease)    IC (interstitial cystitis)    Wolff-Parkinson-White (WPW) syndrome     Past Surgical History:  Procedure Laterality Date   CYSTOSCOPY  2017   WISDOM TOOTH EXTRACTION  2012    Family History  Problem Relation Age of Onset   Evelene Croon Parkinson White syndrome Father    Evelene Croon Parkinson White syndrome Maternal Grandmother     Social History   Tobacco Use   Smoking status: Never   Smokeless tobacco: Never  Vaping Use   Vaping Use: Never used  Substance Use Topics   Alcohol use: No   Drug use: No    Allergies: No Known Allergies  Medications Prior to Admission  Medication Sig Dispense Refill Last Dose   iron polysaccharides (FERREX 150) 150 MG capsule  Take 1 capsule (150 mg total) by mouth daily. 30 capsule 1 03/18/2022   magnesium oxide (MAG-OX) 400 (241.3 Mg) MG tablet Take 1 tablet (400 mg total) by mouth daily. 30 tablet 1 03/18/2022   Prenatal 28-0.8 MG TABS Take by mouth daily.   03/18/2022   acetaminophen (TYLENOL) 325 MG tablet Take 2 tablets (650 mg total) by mouth every 4 (four) hours as needed (for pain scale < 4). 30 tablet 1    benzocaine-Menthol (DERMOPLAST) 20-0.5 % AERO Apply 1 application topically as needed for irritation (perineal discomfort). 56 g 1    coconut oil OIL Apply 1 application topically as needed.  0    ferrous sulfate 325 (65 FE) MG EC tablet Take 325 mg by mouth 3 (three) times daily with meals.      ibuprofen (ADVIL) 600 MG tablet Take 1 tablet (600 mg total) by mouth every 6 (six) hours. 30 tablet 0     Review of Systems  Constitutional:  Negative for chills, fatigue and fever.  Eyes:  Negative for pain and visual disturbance.  Respiratory:  Negative for apnea, shortness of breath and wheezing.   Cardiovascular:  Negative for chest pain and palpitations.  Gastrointestinal:  Negative for abdominal pain, constipation, diarrhea, nausea and vomiting.  Genitourinary:  Positive for vaginal bleeding. Negative for difficulty urinating, dysuria, pelvic pain, vaginal discharge and vaginal pain.  Musculoskeletal:  Positive for back pain.  Neurological:  Negative for seizures, weakness, light-headedness and headaches.  Psychiatric/Behavioral:  Negative for suicidal ideas.    Physical Exam   Blood pressure 117/74, pulse 94, temperature 98 F (36.7 C), temperature source Oral, resp. rate 17, height 5\' 6"  (1.676 m), weight 83.9 kg, SpO2 96 %, currently breastfeeding.  Physical Exam Vitals and nursing note reviewed. Exam conducted with a chaperone present.  Constitutional:      General: She is not in acute distress.    Appearance: Normal appearance.  HENT:     Head: Normocephalic.  Pulmonary:     Effort:  Pulmonary effort is normal.  Genitourinary:    Vagina: Bleeding present.     Cervix: Cervical bleeding present.     Comments: As cervix popped into view, a large bright red bleeding observed in speculum.  Musculoskeletal:     Cervical back: Normal range of motion.  Skin:    General: Skin is warm and dry.     Capillary Refill: Capillary refill takes less than 2 seconds.  Neurological:     Mental Status: She is alert and oriented to person, place, and time.  Psychiatric:        Mood and Affect: Mood normal.    FHT: 145 bpm with moderate variability. 10x10 accels no decels (appropriate for gestational age.)  Toco: quiet   MAU Course  Procedures Sterile Spec Exam revealed significant bright red bleeding that pooled into speculum.   MDM - Call placed to [facility] attending - Dr. Ronita Hipps. CNM discussed patient presentation and current clinical picture. Per MD admit to [facility] with overnight continuous monitoring, IV for saline lock and labs.    Assessment and Plan  - Admit to [facility] for observation.   Jacquiline Doe, MSN CNM  03/19/2022, 9:53 PM

## 2022-03-20 ENCOUNTER — Other Ambulatory Visit: Payer: Self-pay

## 2022-03-20 LAB — CBC
HCT: 34.8 % — ABNORMAL LOW (ref 36.0–46.0)
Hemoglobin: 11.5 g/dL — ABNORMAL LOW (ref 12.0–15.0)
MCH: 30.9 pg (ref 26.0–34.0)
MCHC: 33 g/dL (ref 30.0–36.0)
MCV: 93.5 fL (ref 80.0–100.0)
Platelets: 194 10*3/uL (ref 150–400)
RBC: 3.72 MIL/uL — ABNORMAL LOW (ref 3.87–5.11)
RDW: 12.4 % (ref 11.5–15.5)
WBC: 10.2 10*3/uL (ref 4.0–10.5)
nRBC: 0 % (ref 0.0–0.2)

## 2022-03-20 LAB — TYPE AND SCREEN
ABO/RH(D): O POS
Antibody Screen: NEGATIVE

## 2022-03-20 LAB — RPR: RPR Ser Ql: NONREACTIVE

## 2022-03-20 MED ORDER — BETAMETHASONE SOD PHOS & ACET 6 (3-3) MG/ML IJ SUSP
12.0000 mg | INTRAMUSCULAR | Status: AC
  Administered 2022-03-20 – 2022-03-21 (×2): 12 mg via INTRAMUSCULAR
  Filled 2022-03-20 (×2): qty 5

## 2022-03-20 NOTE — Progress Notes (Signed)
HD 1 [redacted]w[redacted]d IUP Previa, 1st episode bleeding  S: Good FM. Occ cramping has improved. No LOF. Passed small clot this am followed be episode of BRB (spotting per pt). No bleeding now.  O: BP 106/65 (BP Location: Left Arm)   Pulse 94   Temp 98.3 F (36.8 C) (Oral)   Resp 16   Ht 5\' 6"  (1.676 m)   Wt 83.9 kg   SpO2 98%   BMI 29.86 kg/m   HEENT: nl Neck : supple Lgs: CTA CV: RRR ABD: gravid, NT to palpation No CVAT EXT: no cords. SCDs. Neuro: non focal Skin: intact  CBC    Component Value Date/Time   WBC 10.2 03/19/2022 2332   RBC 3.72 (L) 03/19/2022 2332   HGB 11.5 (L) 03/19/2022 2332   HGB 13.7 12/14/2017 1022   HCT 34.8 (L) 03/19/2022 2332   HCT 42.4 12/14/2017 1022   PLT 194 03/19/2022 2332   PLT 217 12/14/2017 1022   MCV 93.5 03/19/2022 2332   MCV 95 12/14/2017 1022   MCH 30.9 03/19/2022 2332   MCHC 33.0 03/19/2022 2332   RDW 12.4 03/19/2022 2332   RDW 13.7 12/14/2017 1022   FHT 150s, BTBV 5-25, No contractions. No decels. Occ 10x10 accels. Category 1.  IMP: 26w 2d IUP Placenta previa- first bleeding episode. Maternal and fetal status reassuring. History of Gest HTN on LDASA  P: Inpatient surveillance until 24hrs with no bleeding.  Cont EFM/TOCO  12/16/2017 face to face and chart review.

## 2022-03-20 NOTE — H&P (Addendum)
Katherine Schultz is a 26 y.o. female presenting for vaginal bleeding , moderate episode x one, known previa, last sono 11/16. Good FM , Denies contractions. OB History     Gravida  2   Para  1   Term  1   Preterm      AB      Living  1      SAB      IAB      Ectopic      Multiple  0   Live Births  1          Past Medical History:  Diagnosis Date   Arrhythmia    Tachycardia   GERD (gastroesophageal reflux disease)    IC (interstitial cystitis)    Wolff-Parkinson-White (WPW) syndrome    Past Surgical History:  Procedure Laterality Date   CYSTOSCOPY  2017   WISDOM TOOTH EXTRACTION  2012   Family History: family history includes Evelene Croon Parkinson White syndrome in her father and maternal grandmother. Social History:  reports that she has never smoked. She has never used smokeless tobacco. She reports that she does not drink alcohol and does not use drugs.     Maternal Diabetes: No Genetic Screening: Normal Maternal Ultrasounds/Referrals: Normal Fetal Ultrasounds or other Referrals:  None Maternal Substance Abuse:  No Significant Maternal Medications:  None Significant Maternal Lab Results:  None Number of Prenatal Visits:greater than 3 verified prenatal visits Other Comments:  None  Review of Systems  Constitutional: Negative.   All other systems reviewed and are negative.  Maternal Medical History:  Reason for admission: Vaginal bleeding.   Contractions: Onset was yesterday.   Frequency: rare.   Perceived severity is mild.   Fetal activity: Perceived fetal activity is normal.   Last perceived fetal movement was within the past hour.   Prenatal complications: Placental abnormality.   Prenatal Complications - Diabetes: none.     Blood pressure 101/66, pulse 91, temperature 97.7 F (36.5 C), temperature source Oral, resp. rate 16, height 5\' 6"  (1.676 m), weight 83.9 kg, SpO2 99 %, currently breastfeeding. Maternal Exam:  Uterine Assessment:  Contraction strength is mild.  Contraction frequency is rare.  Abdomen: Patient reports no abdominal tenderness. Fetal presentation: vertex Introitus: Normal vulva. Normal vagina.  Ferning test: not done.  Nitrazine test: not done. Amniotic fluid character: not assessed. Pelvis: adequate for delivery.   Cervix: Cervix evaluated by sterile speculum exam.     Physical Exam Constitutional:      Appearance: Normal appearance. She is normal weight.  HENT:     Head: Normocephalic and atraumatic.  Cardiovascular:     Rate and Rhythm: Normal rate and regular rhythm.     Pulses: Normal pulses.     Heart sounds: Normal heart sounds.  Pulmonary:     Effort: Pulmonary effort is normal.     Breath sounds: Normal breath sounds.  Abdominal:     General: Bowel sounds are normal.     Palpations: Abdomen is soft.  Genitourinary:    General: Normal vulva.  Musculoskeletal:     Cervical back: Normal range of motion and neck supple.  Skin:    General: Skin is warm.  Neurological:     General: No focal deficit present.     Mental Status: She is alert.  Psychiatric:        Mood and Affect: Mood normal.     Prenatal labs: ABO, Rh: --/--/O POS (11/26 2351) Antibody: NEG (11/26 2351) Rubella:  RPR:    HBsAg:    HIV:    GBS:     Assessment/Plan: 26+ week iup Placenta previa with first episode of bleeding  WPW- asymptomatic - followed by cardiology History of Gest HTN- LDASA  Admit BMZ recommended Cont EFM and TOCO CBC, T&S. Lamonta Cypress J 03/20/2022, 7:58 AM  35 nin face to face and chart review.

## 2022-03-20 NOTE — Progress Notes (Signed)
FHR 140-150s, BTBV 5-25, occ 10x10 accels, no decels Category 1 No contractions noted. Rare UI

## 2022-03-21 NOTE — Discharge Summary (Signed)
Physician Discharge Summary  Patient ID: Katherine Schultz MRN: [id number] DOB/AGE: [birth year] 28 y.o.  Admit date: 03/19/2022 Discharge date: 03/21/2022  Admission Diagnoses:  Vaginal bleeding Complete placenta previa  Discharge Diagnoses:  Principal Problem:   Vaginal bleeding   Discharged Condition: good  Hospital Course: 03/19/22: HD#1 Patient 26Y G2P1001 @ 26.1 presented to MAU with VB in setting of known placenta previa. She was admitted to antepartum service for close inpatient monitoring. No continued active bleeding and cervix visually closed with reassuring fetal status on EFM. Initially declined BMZ 03/20/22: HD#2 Patient stable overnight but did have another smaller gush of VB in the AM around 10. Reports darker color small clot, no BRB, no cramping. Reassuring fetal status on continuous EFM. Patient agreed to [facility] for fetal lung maturity. 03/21/22: HD#3 Patient stable overnight and without any VB for >24 hours. She reports no pain or cramping. Good FM. Reassuring fetal status on monitoring. Received 2nd dose of BMZ. Discharged home on modified bed rest, strict pelvic rest, and close outpatient follow up.  Consults: None  Significant Diagnostic Studies: labs: CBC    Component Value Date/Time   WBC 10.2 03/19/2022 2332   RBC 3.72 (L) 03/19/2022 2332   HGB 11.5 (L) 03/19/2022 2332   HGB 13.7 12/14/2017 1022   HCT 34.8 (L) 03/19/2022 2332   HCT 42.4 12/14/2017 1022   PLT 194 03/19/2022 2332   PLT 217 12/14/2017 1022   MCV 93.5 03/19/2022 2332   MCV 95 12/14/2017 1022   MCH 30.9 03/19/2022 2332   MCHC 33.0 03/19/2022 2332   RDW 12.4 03/19/2022 2332   RDW 13.7 12/14/2017 1022     Treatments: steroids: Betamethasone fetal lung maturity  Discharge Exam: Blood pressure 114/74, pulse 97, temperature 98.1 F (36.7 C), temperature source Oral, resp. rate 16, height 5\' 6"  (1.676 m), weight 83.9 kg, SpO2 100 %, currently breastfeeding. General appearance: alert and  no distress Head: Normocephalic, without obvious abnormality, atraumatic Cardio: regular rate and rhythm GI: soft, non-tender; bowel sounds normal; no masses,  no organomegaly Pelvic: deferred Skin: Skin color, texture, turgor normal. No rashes or lesions  Disposition: Discharge disposition: 01-Home or Self Care       Discharge Instructions     Call MD for:   Complete by: As directed    Any vaginal bleeding, pelvic/abdominal pain/cramping, decreased fetal movements,or any other concerns   Diet - low sodium heart healthy   Complete by: As directed    Discharge instructions   Complete by: As directed    Modified bed rest: encourage daily walking but avoid/limit heavy lifting and strenuous physical activities   Increase activity slowly   Complete by: As directed    Sexual Activity Restrictions   Complete by: As directed    Pelvic rest, nothing inside the vagina until otherwise stated      Allergies as of 03/21/2022   No Known Allergies      Medication List     STOP taking these medications    acetaminophen 325 MG tablet Commonly known as: Tylenol   benzocaine-Menthol 20-0.5 % Aero Commonly known as: DERMOPLAST   coconut oil Oil   ferrous sulfate 325 (65 FE) MG EC tablet   ibuprofen 600 MG tablet Commonly known as: ADVIL   iron polysaccharides 150 MG capsule Commonly known as: Ferrex 150   magnesium oxide 400 (241.3 Mg) MG tablet Commonly known as: MAG-OX       TAKE these medications    Prenatal 28-0.8 MG Tabs  Take by mouth daily.         Signed: Chantrell Apsey A Mikaili Flippin 03/21/2022, 11:23 AM

## 2022-03-21 NOTE — Progress Notes (Signed)
Patient discharged to home with husband.  Condition stable.  Patient ambulated to car with A. Katherine Schultz, NT.  No equipment for home ordered at discharge.

## 2022-03-22 ENCOUNTER — Encounter (HOSPITAL_COMMUNITY): Payer: Self-pay | Admitting: Obstetrics

## 2022-03-22 ENCOUNTER — Other Ambulatory Visit: Payer: Self-pay

## 2022-03-22 ENCOUNTER — Inpatient Hospital Stay (HOSPITAL_COMMUNITY)
Admission: AD | Admit: 2022-03-22 | Discharge: 2022-03-29 | DRG: 832 | Disposition: A | Discharge status: Home / Self Care | Payer: Managed Care, Other (non HMO) | Attending: Obstetrics | Admitting: Obstetrics

## 2022-03-22 DIAGNOSIS — O4400 Placenta previa specified as without hemorrhage, unspecified trimester: Secondary | ICD-10-CM | POA: Diagnosis present

## 2022-03-22 DIAGNOSIS — O4411 Placenta previa with hemorrhage, first trimester: Secondary | ICD-10-CM | POA: Diagnosis not present

## 2022-03-22 DIAGNOSIS — Z8249 Family history of ischemic heart disease and other diseases of the circulatory system: Secondary | ICD-10-CM | POA: Diagnosis not present

## 2022-03-22 DIAGNOSIS — Z3A27 27 weeks gestation of pregnancy: Secondary | ICD-10-CM | POA: Diagnosis not present

## 2022-03-22 DIAGNOSIS — Z363 Encounter for antenatal screening for malformations: Secondary | ICD-10-CM | POA: Diagnosis not present

## 2022-03-22 DIAGNOSIS — O4402 Placenta previa specified as without hemorrhage, second trimester: Secondary | ICD-10-CM | POA: Diagnosis not present

## 2022-03-22 DIAGNOSIS — O4692 Antepartum hemorrhage, unspecified, second trimester: Secondary | ICD-10-CM | POA: Diagnosis present

## 2022-03-22 DIAGNOSIS — O99012 Anemia complicating pregnancy, second trimester: Secondary | ICD-10-CM | POA: Diagnosis present

## 2022-03-22 DIAGNOSIS — I456 Pre-excitation syndrome: Secondary | ICD-10-CM | POA: Diagnosis present

## 2022-03-22 DIAGNOSIS — O99412 Diseases of the circulatory system complicating pregnancy, second trimester: Secondary | ICD-10-CM | POA: Diagnosis present

## 2022-03-22 DIAGNOSIS — D509 Iron deficiency anemia, unspecified: Secondary | ICD-10-CM | POA: Diagnosis present

## 2022-03-22 DIAGNOSIS — Z3A26 26 weeks gestation of pregnancy: Secondary | ICD-10-CM | POA: Diagnosis not present

## 2022-03-22 DIAGNOSIS — O4412 Placenta previa with hemorrhage, second trimester: Secondary | ICD-10-CM | POA: Diagnosis present

## 2022-03-22 LAB — CBC
HCT: 34 % — ABNORMAL LOW (ref 36.0–46.0)
Hemoglobin: 11 g/dL — ABNORMAL LOW (ref 12.0–15.0)
MCH: 30.4 pg (ref 26.0–34.0)
MCHC: 32.4 g/dL (ref 30.0–36.0)
MCV: 93.9 fL (ref 80.0–100.0)
Platelets: 182 10*3/uL (ref 150–400)
RBC: 3.62 MIL/uL — ABNORMAL LOW (ref 3.87–5.11)
RDW: 12.3 % (ref 11.5–15.5)
WBC: 11.3 10*3/uL — ABNORMAL HIGH (ref 4.0–10.5)
nRBC: 0 % (ref 0.0–0.2)

## 2022-03-22 LAB — TYPE AND SCREEN
ABO/RH(D): O POS
Antibody Screen: NEGATIVE

## 2022-03-22 MED ORDER — NIFEDIPINE 10 MG PO CAPS
10.0000 mg | ORAL_CAPSULE | Freq: Four times a day (QID) | ORAL | Status: DC | PRN
  Administered 2022-03-22 – 2022-03-25 (×3): 10 mg via ORAL
  Filled 2022-03-22 (×3): qty 1

## 2022-03-22 MED ORDER — FAMOTIDINE 20 MG PO TABS
20.0000 mg | ORAL_TABLET | Freq: Every day | ORAL | Status: DC
  Administered 2022-03-22 – 2022-03-29 (×8): 20 mg via ORAL
  Filled 2022-03-22 (×8): qty 1

## 2022-03-22 MED ORDER — CALCIUM CARBONATE ANTACID 500 MG PO CHEW: 2.0000 | CHEWABLE_TABLET | ORAL | Status: DC | PRN

## 2022-03-22 MED ORDER — DOCUSATE SODIUM 100 MG PO CAPS
100.0000 mg | ORAL_CAPSULE | Freq: Two times a day (BID) | ORAL | Status: DC
  Administered 2022-03-22 – 2022-03-29 (×14): 100 mg via ORAL
  Filled 2022-03-22 (×14): qty 1

## 2022-03-22 MED ORDER — PRENATAL MULTIVITAMIN CH
1.0000 | ORAL_TABLET | Freq: Every day | ORAL | Status: DC
  Administered 2022-03-22 – 2022-03-28 (×7): 1 via ORAL
  Filled 2022-03-22 (×7): qty 1

## 2022-03-22 MED ORDER — DOCUSATE SODIUM 100 MG PO CAPS
100.0000 mg | ORAL_CAPSULE | Freq: Every day | ORAL | Status: DC
  Administered 2022-03-22: 100 mg via ORAL
  Filled 2022-03-22: qty 1

## 2022-03-22 MED ORDER — LACTATED RINGERS IV SOLN: 125.0000 mL/h | INTRAVENOUS | Status: AC

## 2022-03-22 MED ORDER — ACETAMINOPHEN 325 MG PO TABS: 650.0000 mg | ORAL_TABLET | ORAL | Status: DC | PRN

## 2022-03-22 NOTE — MAU Note (Signed)
.  Katherine Schultz is a 26 y.o. at [redacted]w[redacted]d here in MAU reporting vag bleeding at 0300 with baseball size clot. Pt just went home Tues from [facility] after being admitted for first vag bleeding due to complete previa. Reports good FM  Onset of complaint: 0300 Pain score: 3 Vitals:   03/22/22 0430  Pulse: 95  Resp: 17  Temp: 98.2 F (36.8 C)  SpO2: 99%     FHT:152 Lab orders placed from triage:  none

## 2022-03-22 NOTE — H&P (Addendum)
Alicen Dai is a 26 y.o. G2P1001 at [redacted]w[redacted]d presenting for acute onset vaginal bleeding.  Patient was admitted on November 26 for first episode of vaginal bleeding in the setting of a known placenta previa.  Her prior ultrasound had been in the office on November 16.  Patient was admitted for arrest and betamethasone.  After she had no bleeding for 24 hours she was discharged home in the mid day on November 28.  Patient notes having a restful day at home without contractions and good fetal movement.  She awoke at 230 this morning with slight cramping and passing a baseball size clot of bright red blood.  Since then she has not had any more bleeding.  She had several voids without any staining.  She has mild cramping.  She notes good fetal movement.  Patient has had a prior vaginal delivery, no history of GYN surgery, no history of D&Cs or pregnancy losses.3   [facility] [employer] [facility] since first trimester -History of vaginal delivery at term -History of late onset gestational hypertension, on baby aspirin -Wolff-Parkinson-White, currently asymptomatic and followed by cardiology  Prenatal Transfer Tool  Maternal Diabetes: No, screening not yet done Genetic Screening: declined Maternal Ultrasounds/Referrals: Normal Fetal Ultrasounds or other Referrals:  None Maternal Substance Abuse:  No Significant Maternal Medications:  None Significant Maternal Lab Results: Not yet done     OB History     Gravida  2   Para  1   Term  1   Preterm      AB      Living  1      SAB      IAB      Ectopic      Multiple  0   Live Births  1          Past Medical History:  Diagnosis Date   Arrhythmia    Tachycardia   GERD (gastroesophageal reflux disease)    IC (interstitial cystitis)    Wolff-Parkinson-White (WPW) syndrome    Past Surgical History:  Procedure Laterality Date   CYSTOSCOPY  2017   WISDOM TOOTH EXTRACTION  2012   Family History: family history includes  Yves Dill Parkinson White syndrome in her father and maternal grandmother. Social History:  reports that she has never smoked. She has never used smokeless tobacco. She reports that she does not drink alcohol and does not use drugs.  Review of Systems - Negative except bleeding and cramping as above     Blood pressure 109/68, pulse 94, temperature 98.2 F (36.8 C), temperature source Oral, resp. rate 16, height 5\' 6"  (1.676 m), weight 82.1 kg, SpO2 98 %, currently breastfeeding.  Physical Exam:  Gen: well appearing, no distress CV: RRR Pulm: CTAB Back: no CVAT Abd: gravid, NT, no RUQ pain LE: No edema, equal bilaterally, non-tender Toco: No contractions or irritability seen FH: baseline 145's, 10 x 10 accelerations present, no deceleratons, 10 beat variability  Prenatal labs: ABO, Rh: --/--/O POS (11/29 0522) Antibody: NEG (11/29 0522) Rubella: Immune (08/11 0000) RPR: NON REACTIVE (11/26 2332)  HBsAg: Negative (08/11 0000)  HIV: Non-reactive (08/11 0000)  GBS:   Not yet done 1 hr Glucola not yet done  Genetic screening declined Anatomy US normal with placenta previa   Assessment/Plan: 26 y.o. G2P1001 at [redacted]w[redacted]d Placenta previa now with second bleed.  Discussed with patient Procardia to decrease uterine irritability and subsequent bleeding, will need inpatient admission for 1 week then can reassess.  She is  status post betamethasone.  With urgent need for delivery would plan magnesium but would likely need C-section.  Given bedrest with bathroom privileges will plan Protonix for GERD prevention and stool softener.  Importance of SCDs discussed with patient.  Should she stay without bleeding or contractions for 24 hours will allow intermittent monitoring and wheelchair privileges.  Plan growth ultrasound with reassessment of placenta 3 weeks from last.  Plan diabetic screen prior to discharge.  Risks of blood transfusions discussed with patient she accepts blood in the event and  emergency but requests to have family members bank blood in her name to ensure blood that has come from a donor who has not been vaccinated.   Keep type and screen active every 3 days.  Hemoglobin stable.  Markeita Alicia A Trystyn Dolley 03/22/2022, 11:02 AM  55 min spent in care on pt today.

## 2022-03-22 NOTE — MAU Provider Note (Signed)
Chief Complaint:  Vaginal Bleeding   Event Date/Time   First Provider Initiated Contact with Patient 03/22/22 0456      HPI: Katherine Schultz is a 26 y.o. G2P1001 at 23w4dwho presents to maternity admissions reporting recurrence of vaginal bleeding at home with a known placenta previa.  Had some cramping and passed a baseball sized clot.  Minimal bleeding after passage. . She reports good fetal movement, denies LOF, vaginal itching/burning, urinary symptoms, h/a, dizziness, n/v, diarrhea, constipation or fever/chills.    Vaginal Bleeding The patient's primary symptoms include pelvic pain and vaginal bleeding. The patient's pertinent negatives include no genital itching, genital lesions or genital odor. This is a recurrent problem. The current episode started today. The pain is mild. She is pregnant. Pertinent negatives include no chills, fever, headaches or nausea. The vaginal discharge was bloody. She has been passing clots. She has not been passing tissue. Nothing aggravates the symptoms. She has tried nothing for the symptoms.   Past Medical History: Past Medical History:  Diagnosis Date   Arrhythmia    Tachycardia   GERD (gastroesophageal reflux disease)    IC (interstitial cystitis)    Wolff-Parkinson-White (WPW) syndrome     Past obstetric history: OB History  Gravida Para Term Preterm AB Living  2 1 1     1   SAB IAB Ectopic Multiple Live Births        0 1    # Outcome Date GA Lbr Len/2nd Weight Sex Delivery Anes PTL Lv  2 Current           1 Term 01/30/20 [redacted]w[redacted]d 01:53 / 00:24 3345 g M Vag-Spont EPI  LIV     Birth Comments: WNL    Past Surgical History: Past Surgical History:  Procedure Laterality Date   CYSTOSCOPY  2017   WISDOM TOOTH EXTRACTION  2012    Family History: Family History  Problem Relation Age of Onset   2013 Parkinson White syndrome Father    Evelene Croon Parkinson White syndrome Maternal Grandmother     Social History: Social History   Tobacco  Use   Smoking status: Never   Smokeless tobacco: Never  Vaping Use   Vaping Use: Never used  Substance Use Topics   Alcohol use: No   Drug use: No    Allergies: No Known Allergies  Meds:  Medications Prior to Admission  Medication Sig Dispense Refill Last Dose   lactobacillus acidophilus (BACID) TABS tablet Take 2 tablets by mouth 3 (three) times daily.   03/21/2022   magnesium (MAGTAB) 84 MG (03/23/2022) TBCR SR tablet Take 84 mg by mouth.   03/21/2022   Prenatal 28-0.8 MG TABS Take by mouth daily.   03/21/2022    I have reviewed patient's Past Medical Hx, Surgical Hx, Family Hx, Social Hx, medications and allergies.   ROS:  Review of Systems  Constitutional:  Negative for chills and fever.  Gastrointestinal:  Negative for nausea.  Genitourinary:  Positive for pelvic pain and vaginal bleeding.  Neurological:  Negative for headaches.   Other systems negative  Physical Exam  Patient Vitals for the past 24 hrs:  BP Temp Pulse Resp SpO2 Height Weight  03/22/22 0444 115/82 -- 91 -- -- -- --  03/22/22 0430 -- 98.2 F (36.8 C) 95 17 99 % 5\' 6"  (1.676 m) 82.1 kg   Constitutional: Well-developed, well-nourished female in no acute distress.  Cardiovascular: normal rate and rhythm Respiratory: normal effort, clear to auscultation bilaterally GI: Abd soft, non-tender, gravid appropriate  for gestational age.   No rebound or guarding. MS: Extremities nontender, no edema, normal ROM Neurologic: Alert and oriented x 4.  GU: Neg CVAT.  PELVIC EXAM: speculum exam deferred.  Small spot of blood on pad, no active hemorrhage  FHT:  Baseline 145 , moderate variability, accelerations present, no decelerations Contractions: uterine irritability   Labs: CBC and Type and screen ordered --/--/O POS (11/26 2351)  Imaging:  No results found.  MAU Course/MDM: I have reviewed the triage vital signs and the nursing notes.   Pertinent labs & imaging results that were available during my care  of the patient were reviewed by me and considered in my medical decision making (see chart for details).      I have reviewed her medical records including past results, notes and treatments.   I have ordered labs and IV access NST reviewed Consult Dr Para March and Dr Ernestina Penna with presentation, exam findings and test results. They recommend readmission Treatments in MAU included IV, Procardia q6h.    Assessment: Single IUP at [redacted]w[redacted]d Placenta Previa Vaginal Bleeding Uterine irritability  Plan: Admit to [facility] Routine orders Procardia for [facility] to follow  Wynelle Bourgeois CNM, MSN Certified Nurse-Midwife 03/22/2022 4:56 AM

## 2022-03-23 MED ORDER — FERROUS SULFATE 325 (65 FE) MG PO TABS
325.0000 mg | ORAL_TABLET | Freq: Two times a day (BID) | ORAL | Status: DC
  Administered 2022-03-23 – 2022-03-29 (×11): 325 mg via ORAL
  Filled 2022-03-23 (×11): qty 1

## 2022-03-23 NOTE — Progress Notes (Signed)
HD #2, readmit for Placenta Previa #2nd bleed 26.5 weeks  S: No bleeding noted since admission episode. Taking Procardia as needed for contractions (took last evening). No dizziness/ CP/ SOB. Has questions about her blood count.   O:  BP (!) 100/57 (BP Location: Right Arm)   Pulse 83   Temp 98.4 F (36.9 C) (Oral)   Resp 18   Ht 5\' 6"  (1.676 m)   Wt 82.1 kg   SpO2 99%   BMI 29.21 kg/m  NAD, pleasant  Abdo soft gravid relaxed uterus  Extr no c/c/e or calf tenderness   9 pm NST 140s md variab, + accels no decels - reactive  Toco none   10 am NST 140s mod variability + accels no decels - reactive Toco none      Latest Ref Rng & Units 03/22/2022    5:22 AM 03/19/2022   11:32 PM  CBC  WBC 4.0 - 10.5 K/uL 11.3  10.2   Hemoglobin 12.0 - 15.0 g/dL 03/21/2022  93.8   Hematocrit 36.0 - 46.0 % 34.0  34.8   Platelets 150 - 400 K/uL 182  194      A/P: 26 yo G2P1001, 26.5 wks with bleeding placenta previa, 2nd bleed Previa- Bleeding has stopped. Procardia 10mg  q 6 hrs prn contractions/ cramping              Declines blood transfusion from vaccinated donors. Reviewed in case of emergency/ life threatening situation- she WILL ACCEPT any blood              Repeat sono in 2 weeks              C/section if previa persists at 36 wks, sooner if large bleeding/ hemodynamic unstable Anemia- PO Iron. Reviewed IV Iron to increase ferritin and H/H and minimize blood transfusion need, will consider  Prematurity - s/p BTMZ course at 1st admission. Magnesium sulfate 6 gm if delivery anticipated for fetal neuro-protection  Fetal testing- switch to TID NST since stopped bleeding but continuous if bleeding recurs  Prenatal care on Ante unit- PNvitamins, Glucola next week, RPR 28 wks. Declines vaccines Allow wheelchair ride once daily and bathroom privileges since bleeding has stopped.  --26 MD  

## 2022-03-23 NOTE — Plan of Care (Signed)
  Problem: Education: Goal: Knowledge of disease or condition will improve Outcome: Completed/Met Goal: Knowledge of the prescribed therapeutic regimen will improve Outcome: Completed/Met   Problem: Education: Goal: Knowledge of General Education information will improve Description: Including pain rating scale, medication(s)/side effects and non-pharmacologic comfort measures Outcome: Completed/Met   Problem: Activity: Goal: Risk for activity intolerance will decrease Outcome: Completed/Met   Problem: Nutrition: Goal: Adequate nutrition will be maintained Outcome: Completed/Met   Problem: Coping: Goal: Level of anxiety will decrease Outcome: Completed/Met   Problem: Elimination: Goal: Will not experience complications related to urinary retention Outcome: Completed/Met

## 2022-03-24 NOTE — Progress Notes (Signed)
HD 3, readmit for placenta previa; bleed #2 26.6 wga  No bleeding; no cramping/ctx in last 24 hrs and has not needed procardia; +FM Would like iv iron  Patient Vitals for the past 24 hrs:  BP Temp Temp src Pulse Resp SpO2  03/24/22 0820 102/64 98.4 F (36.9 C) Oral 94 18 100 %  03/23/22 2320 128/76 98.2 F (36.8 C) Oral 91 18 99 %  03/23/22 2004 120/75 98.5 F (36.9 C) Oral 84 20 100 %  03/23/22 1640 107/73 97.9 F (36.6 C) Oral (!) 109 19 99 %  03/23/22 1234 116/68 98.3 F (36.8 C) -- 96 19 100 %   A&ox3 Nml respirations Abd: soft,nt,nd; gravid LE: no edema, nt bilat     Latest Ref Rng & Units 03/22/2022    5:22 AM 03/19/2022   11:32 PM 01/31/2020    4:26 AM  CBC  WBC 4.0 - 10.5 K/uL 11.3  10.2  12.3   Hemoglobin 12.0 - 15.0 g/dL 89.3  81.0  9.2   Hematocrit 36.0 - 46.0 % 34.0  34.8  28.5   Platelets 150 - 400 K/uL 182  194  158    NST:  11/30 2044 FHT: 130s, nml variablity, +accels, no decels TOCO: no ctx  11/30 2316 140s, nml variability, +accels, no decels TOCO: no ctx  12/1 1124 FHT: 140s, nml variability, +accels, no decels TOCO: no ctx  A/P: 26 yo G2P1001, 26.6 wks with bleeding placenta previa, 2nd bleed Previa- Bleeding has stopped. Continue Procardia 10mg  q 6 hrs prn contractions/ cramping              Declines blood transfusion from vaccinated donors. Reviewed in case of emergency/ life threatening situation- she WILL ACCEPT any blood; type and screen q 3days             Repeat sono in 2 weeks              C/section if previa persists at 36 wks, sooner if large bleeding/ hemodynamic unstable Anemia- PO Iron. Pt agrees to IV Iron to increase ferritin and H/H and minimize blood transfusion need, can reassess in 2 wks if further dosing needed  Prematurity - s/p BTMZ 11/27, 11/28; plan Magnesium sulfate 6 gm if delivery anticipated (for fetal neuro-protection)  Fetal testing-  TID NST, reassuring in last 24 hours Prenatal care - PNV qday, Glucola next  week, RPR 28 wks. Declines vaccines Ok for wheelchair ride once daily and bathroom privileges; pt has 28 yr old that visits Plan admission for 1 wk then reassess

## 2022-03-25 LAB — TYPE AND SCREEN
ABO/RH(D): O POS
Antibody Screen: NEGATIVE

## 2022-03-25 MED ORDER — DIPHENHYDRAMINE HCL 50 MG/ML IJ SOLN
25.0000 mg | Freq: Once | INTRAMUSCULAR | Status: AC
  Administered 2022-03-25: 25 mg via INTRAVENOUS
  Filled 2022-03-25: qty 1

## 2022-03-25 MED ORDER — SODIUM CHLORIDE 0.9 % IV SOLN
500.0000 mg | Freq: Once | INTRAVENOUS | Status: AC
  Administered 2022-03-25: 500 mg via INTRAVENOUS
  Filled 2022-03-25: qty 460

## 2022-03-25 NOTE — Progress Notes (Signed)
Katherine Schultz 26 y.o. G2P1001 at [redacted]w[redacted]d HD#4 admitted with second episode of VB in setting of known placenta previa  S: Patient feeling good. Reports no VB since re-admission earlier this week. Denies any further cramping since the first few days, has not feel the need to take any additional Procardia. Walking to the bathroom and within the room. Denies any dizziness. Good FM appreciated. Urinating and BM normal. Patient asking about IV iron today.   O: Vitals:   03/24/22 1523 03/24/22 1951 03/25/22 0633 03/25/22 0945  BP: 114/74 124/75 (!) 87/58 108/62  Pulse: 94 (!) 107 83 (!) 101  Resp: 18 19 16 18   Temp: 98.4 F (36.9 C) 97.8 F (36.6 C) 98.1 F (36.7 C) 97.9 F (36.6 C)  TempSrc: Oral Oral Oral Oral  SpO2: 99% 100% 98% 98%  Weight:      Height:       Exam: -General: AAO, NAD -Cardiovascular: RRR -Respiratory: non-labored breathing -Abdominal: gravid uterus, non-tender -Extremities: neg LE edema  Fetal Assessment: 12/1 PM NST: reactive baseline 135 bpm mod var +accels, -decels. Acontractile on toco 12/2 AM NST: reactive baseline 140 bpm mod var +accels, -decels. Acontractile on toco  CBC    Component Value Date/Time   WBC 11.3 (H) 03/22/2022 0522   RBC 3.62 (L) 03/22/2022 0522   HGB 11.0 (L) 03/22/2022 0522   HGB 13.7 12/14/2017 1022   HCT 34.0 (L) 03/22/2022 0522   HCT 42.4 12/14/2017 1022   PLT 182 03/22/2022 0522   PLT 217 12/14/2017 1022   MCV 93.9 03/22/2022 0522   MCV 95 12/14/2017 1022   MCH 30.4 03/22/2022 0522   MCHC 32.4 03/22/2022 0522   RDW 12.3 03/22/2022 0522   RDW 13.7 12/14/2017 1022     A/P: 12/16/2017 26 y.o. G2P1001 at [redacted]w[redacted]d HD#4 admitted with second VB episode in setting of placenta previa, now clinically stable without any additional bleeding since admission  Placenta Previa -No active bleeding, continue to monitor -NST TID- reassuring -Procardia PRN cramping -Plan for cesarean section if delivery imminent/previa  persistent Preterm  -S/p BMZ 11/27-11/28 -Mag neuroprotection if delivery imminent before 32w -Deferred NICU consult as patient is stable IDA -Counseled on risks/benefits of IV Iron therapy given high bleeding risk with previa, patient requests for IV Iron- order placed, plan for pre-infusion IV Benadryl  -Continue PO Fe as well Antepartum care -Plan for GTT screen next week -Declines Tdap -RPR at 28w -Daily PNV  -Bowel regimen PRN Disposition -Continue inpatient observation for at least 1 week without bleeding as previously discussed, then reassess   Katherine Schultz 03/25/22 11:33 AM

## 2022-03-25 NOTE — Plan of Care (Signed)
  Problem: Health Behavior/Discharge Planning: Goal: Ability to manage health-related needs will improve Outcome: Completed/Met

## 2022-03-26 NOTE — Progress Notes (Signed)
Fransico Him 26 y.o. G2P1001 at [redacted]w[redacted]d HD#5 admitted with 2nd VB episode in setting of placenta previa  S: Patient reports feeling good. Did have 2 minor episodes of cramping yesterday, both after going to bathroom but then self resolved shortly after. Did take Procardia x1 yesterday afternoon. No cramping today. No VB or spotting. No LOF. Good FM throughout. Did well with IV iron yesterday, no reactions. Normal daily BM without straining. Normal urination without burning. Tolerating regular diet without nausea. No fevers/chills.  O: Vitals:   03/25/22 1700 03/25/22 2008 03/25/22 2248 03/26/22 0744  BP: 111/66 129/83 108/75 103/64  Pulse: (!) 107 (!) 111 93 (!) 101  Resp: 20 18 18 18   Temp: 98 F (36.7 C) 98.2 F (36.8 C) 98.2 F (36.8 C) 98.1 F (36.7 C)  TempSrc: Oral Oral Oral Oral  SpO2: 97% 98% 97% 98%  Weight:      Height:       Exam: -General: AAO, NAD -Cardiovascular: RRR -Respiratory: non-labored breathing -Abdominal: gravid uterus, non-tender -Extremities: neg LE edema   Fetal Assessment: 12/2 PM NST: reactive baseline 140 bpm mod var +accels, -decels. Acontractile on toco 12/3 AM NST: reactive baseline 130 bpm mod var +accels, -decels. Acontractile on toco  A/P: Katyra Tomassetti 26 y.o. G2P1001 at [redacted]w[redacted]d HD#5 admitted with 2nd VB episode in setting of placenta previa, now clinically stable without any additional bleeding since re-admission  Placenta Previa -No active bleeding, continue to monitor -NST TID- reassuring -Procardia PRN cramping -Plan for cesarean section if delivery imminent/previa persistent -Follow up [redacted]w[redacted]d scheduled for Wednesday 12/6 Preterm  -S/p BMZ 11/27-11/28 -Mag neuroprotection if delivery imminent before 32w -Deferred NICU consult as patient is stable IDA -S/p IV Venofer 12/2 -Continue PO Fe as well Antepartum care -Plan for GTT screen this week, plan for Tuesday 12/5 to combine w/ TS draw due at that time -Declines  Tdap -RPR at 28w -Daily PNV  -Bowel regimen PRN Disposition -Continue inpatient observation for at least 1 week without bleeding as previously discussed, then reassess    Glee Lashomb A Sylar Voong 03/26/22 12:08 PM

## 2022-03-27 DIAGNOSIS — D509 Iron deficiency anemia, unspecified: Secondary | ICD-10-CM | POA: Diagnosis present

## 2022-03-27 NOTE — Progress Notes (Signed)
Fransico Him 26 y.o. G2P1001 at [redacted]w[redacted]d HD# 6 admitted with 2nd VB episode in setting of placenta previa  S: Patient reports feeling good. Occ cramps. No procardia in 24 hrs. No VB or spotting since admission episode.  No LOF. Good FM throughout. Did well with IV iron on 12/2, no reactions. Normal daily BM without straining. Normal urination without burning. Tolerating regular diet without nausea. No fevers/chills.  O: Vitals:   03/26/22 1618 03/26/22 1912 03/26/22 2159 03/27/22 0906  BP: 119/83 116/75 113/71 108/75  Pulse: 99 (!) 104 (!) 102 93  Resp: 16 18 18 18   Temp: 98.1 F (36.7 C) 98.4 F (36.9 C) 98.1 F (36.7 C) 98.3 F (36.8 C)  TempSrc: Oral Oral Oral Oral  SpO2: 100% 100% 99% 98%  Weight:      Height:       Exam: -General: AAO, NAD -Cardiovascular: RRR -Respiratory: non-labored breathing -Abdominal: gravid uterus, non-tender -Extremities: neg LE edema   Fetal Assessment: 12/3 8 PM NST: reactive baseline 140 bpm mod var +accels, -decels. Acontractile on toco 12/4 10 AM NST: reactive baseline 130 bpm mod var +accels, -decels. Acontractile on toco  A/P: 14/4 26 y.o. G2P1001 at [redacted]w[redacted]d HD# 6 admitted with 2nd VB episode in setting of placenta previa, now clinically stable without any additional bleeding since re-admission  Placenta Previa -No active bleeding, continue to monitor -NST TID- reassuring -Procardia PRN cramping -Plan for cesarean section if delivery imminent/previa persistent -Follow up [redacted]w[redacted]d scheduled for Wednesday 12/6 Preterm  -S/p BMZ 11/27-11/28 -Mag neuroprotection if delivery imminent before 32w -Deferred NICU consult as patient is stable IDA -S/p IV Venofer 12/2 -Continue PO Fe as well Antepartum care -Plan for GTT screen this week, plan for Tuesday 12/5 to combine w/ TS draw due at that time -Declines Tdap -RPR at 28w -Daily PNV  -Bowel regimen PRN Disposition -Continue inpatient observation for at least 1 week  without bleeding as previously discussed, then reassess    14/5 03/27/22 11:40 AM

## 2022-03-28 LAB — TYPE AND SCREEN
ABO/RH(D): O POS
Antibody Screen: NEGATIVE

## 2022-03-28 LAB — GLUCOSE, RANDOM: Glucose, Bld: 110 mg/dL — ABNORMAL HIGH (ref 70–99)

## 2022-03-28 NOTE — Progress Notes (Addendum)
HD 7 27w 3d Previa 2nd bleed  S: Patient reports feeling good. Occ cramps. No procardia in 48hrs. No VB or spotting since admission episode.  No LOF. Good FM throughout. Did well with IV iron on 12/2, no reactions. Normal daily BM without straining. Normal urination without burning. Tolerating regular diet without nausea. No fevers/chills.  O: Vitals:   03/27/22 1718 03/27/22 1957 03/28/22 0512 03/28/22 0925  BP: 111/73 103/72 108/62 111/69  Pulse: (!) 105 (!) 106 96 (!) 106  Resp: 18 18 16 16   Temp: 98 F (36.7 C) 97.9 F (36.6 C) 98.3 F (36.8 C) 98.3 F (36.8 C)  TempSrc: Oral Oral Oral Oral  SpO2: 99%  99% 100%  Weight:      Height:       Exam: -General: AAO, NAD -Cardiovascular: RRR -Respiratory: non-labored breathing -Abdominal: gravid uterus, non-tender -Extremities: neg LE edema -Neuro:  nonfocal -Skin: intact Pelvic deferred   Fetal Assessment: 12/4 1700 NST: reactive baseline 140 bpm mod var +accels, -decels. Acontractile on toco 12/4 2100 NST reactive baseline 150, pos accels, btbv 5-25, no decels, ui on toc 12/510 AM NST: reactive baseline 130 bpm mod var +accels, -decels. Acontractile on toco  A/P [redacted]w[redacted]d HD# 7 admitted with 2nd VB episode in setting of placenta previa, now clinically stable without any additional bleeding since re-admission  Placenta Previa -No active bleeding, continue to monitor -NST TID- reassuring -Procardia PRN cramping- none in 48hrs -Plan for cesarean section if delivery imminent/previa persistent -Follow up [redacted]w[redacted]d scheduled for Wednesday 12/6 Preterm  -S/p BMZ 11/27-11/28 -Mag neuroprotection if delivery imminent before 32w -Deferred NICU consult as patient is stable IDA -S/p IV Venofer 12/2 -Continue PO Fe as well Antepartum care -Plan for GTT done today. -Declines Tdap  -Continue inpatient observation for at least 1 week without bleeding as previously discussed, then reassess Sono in am Assuming stable surveillance, no  bleeding and no changes in current situation , would recommend dc home tomorrow.  35 min total time including face to face, consultation, PE and chart review.    Caio Devera J 03/28/22 9:49 AM

## 2022-03-29 ENCOUNTER — Inpatient Hospital Stay (HOSPITAL_BASED_OUTPATIENT_CLINIC_OR_DEPARTMENT_OTHER): Payer: Managed Care, Other (non HMO)

## 2022-03-29 DIAGNOSIS — O4402 Placenta previa specified as without hemorrhage, second trimester: Secondary | ICD-10-CM

## 2022-03-29 DIAGNOSIS — Z363 Encounter for antenatal screening for malformations: Secondary | ICD-10-CM | POA: Diagnosis not present

## 2022-03-29 DIAGNOSIS — Z3A27 27 weeks gestation of pregnancy: Secondary | ICD-10-CM

## 2022-03-29 MED ORDER — DOCUSATE SODIUM 100 MG PO CAPS: 100.0000 mg | ORAL_CAPSULE | Freq: Two times a day (BID) | ORAL | 1 refills | Status: AC

## 2022-03-29 MED ORDER — SODIUM CHLORIDE 0.9% FLUSH: 3.0000 mL | Freq: Two times a day (BID) | INTRAVENOUS | Status: DC

## 2022-03-29 MED ORDER — NIFEDIPINE 10 MG PO CAPS: 10.0000 mg | ORAL_CAPSULE | Freq: Four times a day (QID) | ORAL | 1 refills | Status: DC | PRN

## 2022-03-29 MED ORDER — SODIUM CHLORIDE 0.9% FLUSH: 3.0000 mL | INTRAVENOUS | Status: DC | PRN

## 2022-03-29 MED ORDER — FAMOTIDINE 20 MG PO TABS: 20.0000 mg | ORAL_TABLET | Freq: Two times a day (BID) | ORAL | 1 refills | Status: AC

## 2022-03-29 NOTE — Plan of Care (Signed)
Patient given discharge instructions: return to [facility]U if VB, ROM, contractions unrelieved by Procardia, decreased FM. Patient verbalizes understanding.

## 2022-03-29 NOTE — Progress Notes (Signed)
Hospital day #8 27 weeks 4 days Previous second bleed  Subjective patient notes no bleeding in 1 week.  No Procardia in 3 days.  Good fetal movement no contractions no leakage of fluid.  Patient has no chest pain or shortness of breath.  Patient taking iron, Colace, Pepcid.  Patient ready for discharge home today  Objective: Vitals:   03/29/22 0502 03/29/22 0503 03/29/22 0834 03/29/22 1126  BP: (!) 103/52 (!) 103/52 104/81 114/75  Pulse: (!) 101  (!) 114 (!) 111  Resp: 17  17 16   Temp: 98 F (36.7 C)  98.2 F (36.8 C) 98.2 F (36.8 C)  TempSrc: Oral  Oral Oral  SpO2: 99%  99% 99%  Weight:      Height:       General: Well-appearing, no distress Cardiovascular: Regular rate and rhythm Pulmonary: Clear to auscultation bilaterally Abdomen: Gravid, no right upper quadrant pain, no fundal tenderness Skin: Warm and dry GU deferred Lower extremity: Nontender, no edema  NST December 5 at 11 PM: 140s, positive accelerations, no decelerations, 10 beat variability, toco: No contractions  NST March 29, 2009 a.m.: Baseline 140s, positive accelerations, no decelerations, 10 beat variability toco: No contractions  Ultrasound December 6: Cervical length 4.2 cm, posterior placenta previa, fetal growth 9 7 4  g, 2 pounds 2 ounces, 12th percentile, normal AFI  Assessment and plan: 26 year old G2 P1-0-0-1 at 27 weeks 4 days, hospital day #8 admitted with second episode of vaginal bleeding in the setting of posterior placenta previa.  Patient has not had any bleeding for 1 week.  Patient is being Procardia as needed and has not had any doses in 3 days.  Patient is requesting to go home.  Patient able to have bed rest at home and lives 30 minutes away.  Precautions reviewed.  Medicines prescribed  03/29/2022 1:02 PM

## 2022-03-29 NOTE — Discharge Summary (Addendum)
Patient ID: Katherine Schultz MRN: [id number] DOB/AGE: [birth year] 28 y.o.  Admit date: 03/22/2022 Discharge date: March 29, 2022  Admission Diagnoses: Placenta previa antepartum [O44.00]  Discharge Diagnoses: Placenta previa antepartum [O44.00]         Discharged Condition: stable  Hospital Course: Patient was admitted with second bleed for known posterior placenta previa.  She was started on Procardia.  She already had betamethasone with her first admission.  After the first several days Procardia was moved to as needed and patient had less need for this.  By the day of discharge she had no bleeding for 1 week.  Patient did her diabetic screen and passed with a score of 110 while in-house.  She declined Tdap.  On day of discharge patient to repeat ultrasound by MFM which showed persistent posterior previa.  Cervical length 4.2 cm.  Growth 974 g, 12 percentile.  Consults: MFM  Treatments: IV hydration, tocolytics, betamethasone  Disposition: home   Allergies as of 03/29/2022   No Known Allergies      Medication List     TAKE these medications    docusate sodium 100 MG capsule Commonly known as: COLACE Take 1 capsule (100 mg total) by mouth 2 (two) times daily.   famotidine 20 MG tablet Commonly known as: PEPCID Take 1 tablet (20 mg total) by mouth 2 (two) times daily.   lactobacillus acidophilus Tabs tablet Take 2 tablets by mouth 3 (three) times daily.   magnesium 84 MG ( ) Tbcr SR tablet Commonly known as: MAGTAB Take 84 mg by mouth.   NIFEdipine 10 MG capsule Commonly known as: PROCARDIA Take 1 capsule (10 mg total) by mouth every 6 (six) hours as needed (cramping).   Prenatal 28-0.8 MG Tabs Take by mouth daily.         Signed: Lendon Colonel, MD MD 03/29/2022, 1:04 PM  About 40 minutes were spent on day of discharge caring for patient.Marland Kitchen

## 2022-04-27 ENCOUNTER — Encounter (HOSPITAL_COMMUNITY): Payer: Self-pay | Admitting: Obstetrics

## 2022-04-27 ENCOUNTER — Inpatient Hospital Stay (HOSPITAL_COMMUNITY)
Admission: AD | Admit: 2022-04-27 | Discharge: 2022-05-13 | DRG: 786 | Disposition: A | Discharge status: Home / Self Care | Payer: Managed Care, Other (non HMO) | Attending: Obstetrics | Admitting: Obstetrics

## 2022-04-27 ENCOUNTER — Other Ambulatory Visit: Payer: Self-pay

## 2022-04-27 DIAGNOSIS — O9942 Diseases of the circulatory system complicating childbirth: Secondary | ICD-10-CM | POA: Diagnosis present

## 2022-04-27 DIAGNOSIS — I456 Pre-excitation syndrome: Secondary | ICD-10-CM | POA: Diagnosis present

## 2022-04-27 DIAGNOSIS — O9912 Other diseases of the blood and blood-forming organs and certain disorders involving the immune mechanism complicating childbirth: Secondary | ICD-10-CM | POA: Diagnosis present

## 2022-04-27 DIAGNOSIS — O4413 Placenta previa with hemorrhage, third trimester: Principal | ICD-10-CM | POA: Diagnosis present

## 2022-04-27 DIAGNOSIS — Z3A31 31 weeks gestation of pregnancy: Secondary | ICD-10-CM

## 2022-04-27 DIAGNOSIS — O9902 Anemia complicating childbirth: Secondary | ICD-10-CM | POA: Diagnosis not present

## 2022-04-27 DIAGNOSIS — D62 Acute posthemorrhagic anemia: Secondary | ICD-10-CM | POA: Diagnosis not present

## 2022-04-27 DIAGNOSIS — Z3A32 32 weeks gestation of pregnancy: Secondary | ICD-10-CM

## 2022-04-27 DIAGNOSIS — O4403 Placenta previa specified as without hemorrhage, third trimester: Secondary | ICD-10-CM | POA: Diagnosis present

## 2022-04-27 DIAGNOSIS — Z98891 History of uterine scar from previous surgery: Secondary | ICD-10-CM

## 2022-04-27 DIAGNOSIS — D500 Iron deficiency anemia secondary to blood loss (chronic): Secondary | ICD-10-CM | POA: Diagnosis not present

## 2022-04-27 DIAGNOSIS — D696 Thrombocytopenia, unspecified: Secondary | ICD-10-CM | POA: Diagnosis present

## 2022-04-27 DIAGNOSIS — O321XX0 Maternal care for breech presentation, not applicable or unspecified: Secondary | ICD-10-CM | POA: Diagnosis present

## 2022-04-27 DIAGNOSIS — Z3A33 33 weeks gestation of pregnancy: Secondary | ICD-10-CM | POA: Diagnosis not present

## 2022-04-27 LAB — CBC
HCT: 36.4 % (ref 36.0–46.0)
Hemoglobin: 11.8 g/dL — ABNORMAL LOW (ref 12.0–15.0)
MCH: 30.6 pg (ref 26.0–34.0)
MCHC: 32.4 g/dL (ref 30.0–36.0)
MCV: 94.3 fL (ref 80.0–100.0)
Platelets: 179 10*3/uL (ref 150–400)
RBC: 3.86 MIL/uL — ABNORMAL LOW (ref 3.87–5.11)
RDW: 13.5 % (ref 11.5–15.5)
WBC: 8.9 10*3/uL (ref 4.0–10.5)
nRBC: 0 % (ref 0.0–0.2)

## 2022-04-27 LAB — TYPE AND SCREEN
ABO/RH(D): O POS
Antibody Screen: NEGATIVE

## 2022-04-27 MED ORDER — LACTATED RINGERS IV SOLN: 125.0000 mL/h | INTRAVENOUS | Status: DC

## 2022-04-27 MED ORDER — CALCIUM CARBONATE ANTACID 500 MG PO CHEW: 2.0000 | CHEWABLE_TABLET | ORAL | Status: DC | PRN

## 2022-04-27 MED ORDER — ORAL CARE MOUTH RINSE: 15.0000 mL | OROMUCOSAL | Status: DC | PRN

## 2022-04-27 MED ORDER — PANTOPRAZOLE SODIUM 40 MG PO TBEC
40.0000 mg | DELAYED_RELEASE_TABLET | Freq: Every day | ORAL | Status: DC
  Administered 2022-04-28 – 2022-05-10 (×11): 40 mg via ORAL
  Filled 2022-04-27 (×12): qty 1

## 2022-04-27 MED ORDER — LACTATED RINGERS IV SOLN
INTRAVENOUS | Status: DC
  Administered 2022-04-27 – 2022-04-28 (×2): 125 mL/h via INTRAVENOUS

## 2022-04-27 MED ORDER — SODIUM CHLORIDE 0.9 % IV SOLN: INTRAVENOUS | Status: DC

## 2022-04-27 MED ORDER — BETAMETHASONE SOD PHOS & ACET 6 (3-3) MG/ML IJ SUSP
12.0000 mg | INTRAMUSCULAR | Status: AC
  Administered 2022-04-27 – 2022-04-28 (×2): 12 mg via INTRAMUSCULAR
  Filled 2022-04-27: qty 5

## 2022-04-27 MED ORDER — DOCUSATE SODIUM 100 MG PO CAPS
100.0000 mg | ORAL_CAPSULE | Freq: Two times a day (BID) | ORAL | Status: DC
  Administered 2022-04-27: 100 mg via ORAL
  Filled 2022-04-27 (×13): qty 1

## 2022-04-27 MED ORDER — NIFEDIPINE 10 MG PO CAPS
10.0000 mg | ORAL_CAPSULE | Freq: Four times a day (QID) | ORAL | Status: DC
  Administered 2022-04-27 – 2022-05-10 (×52): 10 mg via ORAL
  Filled 2022-04-27 (×56): qty 1

## 2022-04-27 MED ORDER — PRENATAL MULTIVITAMIN CH
1.0000 | ORAL_TABLET | Freq: Every day | ORAL | Status: DC
  Administered 2022-04-28 – 2022-05-10 (×12): 1 via ORAL
  Filled 2022-04-27 (×12): qty 1

## 2022-04-27 MED ORDER — ACETAMINOPHEN 325 MG PO TABS: 650.0000 mg | ORAL_TABLET | ORAL | Status: DC | PRN

## 2022-04-27 NOTE — Plan of Care (Signed)
  Problem: Education: Goal: Knowledge of disease or condition will improve Outcome: Completed/Met Goal: Knowledge of the prescribed therapeutic regimen will improve Outcome: Completed/Met   Problem: Education: Goal: Knowledge of General Education information will improve Description: Including pain rating scale, medication(s)/side effects and non-pharmacologic comfort measures Outcome: Completed/Met   Problem: Nutrition: Goal: Adequate nutrition will be maintained Outcome: Completed/Met   Problem: Coping: Goal: Level of anxiety will decrease Outcome: Completed/Met   Problem: Elimination: Goal: Will not experience complications related to urinary retention Outcome: Completed/Met

## 2022-04-27 NOTE — H&P (Addendum)
Katherine Schultz is a 27 y.o. G2P1001 at [redacted]w[redacted]d presenting for acute onset vaginal bleeding this morning.  Patient with known placenta previa.  Last ultrasound in the office 1 week ago with persistent posterior placenta previa.  Patient has had 2 prior admissions for bleeding.  Her first admission was November 26 through November 28 at which time she got betamethasone on November 27 and November 28 at 26 weeks and 2 days.  She was discharged for home bed rest and presented again on November 29.  She spent 1 week in the hospital and had little bleeding after her initial presentation.  Patient notes she has been compliant on home bedrest for the past 4 weeks since she was discharged home on December 6 patient does have home Procardia, 10 mg that she uses once every 2 to 3 days as needed for contractions.  Patient notes awaking this morning with bleeding "like a period when she woke up at 830 this a.m' patient only saw bleeding when she wiped after urination.  She had further voids this morning with no other bleeding.  She did not fill a pad.  She did take 1 Procardia for her cramping this this morning.  Pt notes no contractions. Good fetal movement and is not leaking fluid.  Patient has had a prior vaginal delivery, no history of GYN surgery, no history of D&Cs or pregnancy losses.3     [facility] since first trimester -History of vaginal delivery at term -History of late onset gestational hypertension, on baby aspirin until her first admission.  Patient has not been on baby aspirin since then.  -Wolff-Parkinson-White, currently asymptomatic and followed by cardiology   Prenatal Transfer Tool  Maternal Diabetes: 17 Genetic Screening: declined Maternal Ultrasounds/Referrals: Normal Fetal Ultrasounds or other Referrals:  MFM ultrasound done at last admission.  Compete previa confirmed.  Growth 974 g, 12th percentile with cervical length 4.2 cm on March 29, 2022 Maternal Substance  Abuse:  No Significant Maternal Medications:  None-intermittent Procardia Significant Maternal Lab Results: GBS  not yet done         OB History       Gravida  2   Para  1   Term  1   Preterm      AB      Living  1        SAB      IAB      Ectopic      Multiple  0   Live Births  1                 Past Medical History:  Diagnosis Date   Arrhythmia      Tachycardia   GERD (gastroesophageal reflux disease)     IC (interstitial cystitis)     Wolff-Parkinson-White (WPW) syndrome           Past Surgical History:  Procedure Laterality Date   CYSTOSCOPY   2017   WISDOM TOOTH EXTRACTION   2012    Family History: family history includes Yves Dill Parkinson White syndrome in her father and maternal grandmother. Social History:  reports that she has never smoked. She has never used smokeless tobacco. She reports that she does not drink alcohol and does not use drugs.   Review of Systems - Negative except bleeding and cramping as above       Blood pressure 118/72, pulse (!) 114, temperature 99 F (37.2 C), resp. rate 14, height 5\' 6"  (1.676 m),  weight 83.9 kg, SpO2 100 %, currently breastfeeding.   Physical Exam:  Gen: well appearing, no distress CV: RRR Pulm: CTAB Back: no CVAT Abd: gravid, NT, no RUQ pain, no fundal tenderness LE: No edema, equal bilaterally, non-tender GU: 1-2 scopettes bloody mucous, no active bleeding, no d/c, cvx difficult to visualize but did not appear dilated.  Toco: No contractions, mild irritability seen FH: baseline 130s, positive accelerations present, no deceleratons, 10 beat variability   Prenatal labs: ABO, Rh: --/--/O POS (11/29 0522) Antibody: NEG (11/29 0522) Rubella: Immune (08/11 0000) RPR: NON REACTIVE (11/26 2332)  HBsAg: Negative (08/11 0000)  HIV: Non-reactive (08/11 0000)  GBS:   Not yet done 1 hr Glucola 122      Genetic screening declined Anatomy US normal with placenta previa     Assessment/Plan: 27  y.o. G2P1001 at [redacted]w[redacted]d Placenta previa now with third bleed.  Discussed with patient Procardia to decrease uterine irritability and subsequent bleeding, will need inpatient admission at this time.  Patient understands usual recommendation on third bleed is admit until delivery but patient already requesting to reassess this plan. She is status post betamethasone at 26 weeks and now that it is 5 weeks later will give a repeat dose.  Patient understands and will not be any further betamethasone doses.  We will defer magnesium at this time given she is close to 32 weeks and I do not expect delivery immediately.  With urgent need for delivery would plan magnesium but would likely need urgent C-section.  Plan bedrest with bathroom privileges. Plan Protonix for GERD prevention and stool softener.  Importance of SCDs discussed with patient.     Plan growth ultrasound with reassessment of placenta 3 weeks from last. Due 05/11/22.  Last growth December 28: 33rd percentile, cervical length 4.6 cm, complete breech, transverse lie, AFI 17.   Risks of blood transfusions discussed with patient she accepts blood in the event and emergency.  She no longer has any specific requests as to where this blood comes from.         Keep type and screen active every 3 days.  Reactive fetal testing.  Will plan continuous monitoring overnight.    Ala Dach 04/27/2022 6:09 PM     75 min spent in care on pt today.

## 2022-04-27 NOTE — MAU Note (Signed)
.  Katherine Schultz is a 27 y.o. at [redacted]w[redacted]d here in MAU reporting: bleeding this am and was told to come in for admission for 72 hours. Pt has complete previa. Denies pain. Reports positive fetal movement.  Onset of complaint: this am Pain score:0/10 Vitals:   04/27/22 1643  BP: 118/72  Pulse: (!) 114  Resp: 14  Temp: 99 F (37.2 C)  SpO2: 100%     Lab orders placed from triage:

## 2022-04-28 MED ORDER — BUTALBITAL-APAP-CAFFEINE 50-325-40 MG PO TABS
2.0000 | ORAL_TABLET | ORAL | Status: DC | PRN
  Administered 2022-04-28: 2 via ORAL
  Filled 2022-04-28: qty 2

## 2022-04-28 NOTE — Plan of Care (Signed)
  Problem: Education: Goal: Individualized Educational Video(s) Outcome: Completed/Met   Problem: Clinical Measurements: Goal: Complications related to the disease process, condition or treatment will be avoided or minimized Outcome: Completed/Met   Problem: Health Behavior/Discharge Planning: Goal: Ability to manage health-related needs will improve Outcome: Completed/Met   Problem: Activity: Goal: Risk for activity intolerance will decrease Outcome: Completed/Met   Problem: Safety: Goal: Ability to remain free from injury will improve Outcome: Completed/Met

## 2022-04-28 NOTE — Plan of Care (Signed)
  Problem: Health Behavior/Discharge Planning: Goal: Ability to manage health-related needs will improve Outcome: Completed/Met   Problem: Safety: Goal: Ability to remain free from injury will improve Outcome: Completed/Met

## 2022-04-28 NOTE — Progress Notes (Signed)
Hospital day #2 Placenta previa with third bleed Rescue course of betamethasone January 4 and January 5  Subjective: Patient notes excellent fetal movement no vaginal bleeding since prior to admission.  Patient notes no contractions or cramping.  Patient notes no abdominal pain.  Patient notes mild headache last night none now.  Patient is ambulating to the bathroom voiding without difficulty.  Objective: Vitals:   04/28/22 0814 04/28/22 0819 04/28/22 0824 04/28/22 0829  BP:    114/72  Pulse:    (!) 101  Resp:    18  Temp:    98.2 F (36.8 C)  TempSrc:    Oral  SpO2: 98% 98% 98% 99%  Weight:      Height:       General: Well-appearing, no distress Cardiovascular regular rate and rhythm Pulmonary: Clear to auscultation bilaterally Skin: Warm and dry Abdomen: No right upper quadrant pain, no fundal tenderness, gravid Lower extremity: Nontender, no edema GU deferred  NST April 27, 2022, 11 PM: Fetal baseline 120s, pause accelerations, no decelerations, 10 beat variability.  Tocometry: No contractions.  Reactive NST  NST April 28, 2022, 7 AM.  Fetal baseline 120s, positive accelerations, no decelerations, 10 beat variability.  Tocometry: No contractions.  Reactive NST  CBC    Component Value Date/Time   WBC 8.9 04/27/2022 1804   RBC 3.86 (L) 04/27/2022 1804   HGB 11.8 (L) 04/27/2022 1804   HGB 13.7 12/14/2017 1022   HCT 36.4 04/27/2022 1804   HCT 42.4 12/14/2017 1022   PLT 179 04/27/2022 1804   PLT 217 12/14/2017 1022   MCV 94.3 04/27/2022 1804   MCV 95 12/14/2017 1022   MCH 30.6 04/27/2022 1804   MCHC 32.4 04/27/2022 1804   RDW 13.5 04/27/2022 1804   RDW 13.7 12/14/2017 1022    Assessment and plan: 27 year old G2 P1-0-0-1 at 31 weeks and 6 days now hospital day #2 for third admission with placenta previa with bleeding.  Patient with only single episode short-lived of bleeding prior to admission and no further bleeding.  Given it has been 5 weeks since her last  betamethasone patient is getting rescue course of betamethasone.  Due to initial irritability she is getting Procardia 10 mg every 6 hours.  Patient feels no contractions at this time.  She is on bedrest with bathroom privileges for the first 48 hours after admission.  She has been allowed to eat.  She has been on continuous fetal monitoring and tocometry but will move to every shift and as needed.  She has had IV fluids at 125 cc/h but will saline lock her IV fluids.  She has Colace iron and Protonix as maintenance medications.  35 minutes spent on patient care today  Ala Dach 04/28/2022 9:47 AM

## 2022-04-29 MED ORDER — LACTATED RINGERS IV BOLUS
500.0000 mL | Freq: Once | INTRAVENOUS | Status: AC
  Administered 2022-04-29: 500 mL via INTRAVENOUS

## 2022-04-29 MED ORDER — LACTATED RINGERS IV SOLN
INTRAVENOUS | Status: DC
  Filled 2022-04-29 (×3): qty 1000

## 2022-04-29 NOTE — Progress Notes (Addendum)
Notified  Dr Lanny Cramp of patients complaint of feeling uc's, none noted on monitor or palpated. No vaginal bleeding on pad or at last three voids. Baby has been very active and it was decided to go ahead and give her Procardia dose give her a break on EFM and position change. Baby has looked good on the monitor. Will place back on EFM if any bleeding or complaint of worsening contractions.

## 2022-04-29 NOTE — Progress Notes (Signed)
Katherine Schultz 27 y.o. G2P1001 at [redacted]w[redacted]d HD#3 admitted with 3rd episode of VB in setting of complete placenta previa  Late Entry Note  S: Patient seen and rounded on multiple times throughout the day. First called around 8AM by RN for VB episode. Patient seen at bedside shortly after. She reported a large gush of BRB into the toilet that filled the bowl and continued as she was urinating and then stopped. No obvious clots then. Last VB episode had been on admission Thursday evening 1/4. She had increased cramping that started this morning as well, mild like period cramps. No big gush of fluid. Good FM throughout. Had BM yesterday and denies constipation. Had been hydrating well and tolerating regular diet, no N/V. Normal urination.  O: Vitals:   04/28/22 2310 04/29/22 0558 04/29/22 0917 04/29/22 1220  BP:  101/61 112/77 122/79  Pulse:  83 98 96  Resp: 18 16 16 17   Temp: 98.1 F (36.7 C) 98.2 F (36.8 C) 98.4 F (36.9 C) 98.5 F (36.9 C)  TempSrc: Oral Oral Oral Oral  SpO2: 97% 99% 99% 99%  Weight:  83.9 kg    Height:       Exam -General: AAO, NAD -Abdomen: gravid uterus soft, no palpable contractions, no fundal tenderness -Pelvic: speculum exam done around 1700 due to patient reports of inc abd pain limited visualization- clot present, cervix posterior but appeared to be closed, no active bleeding or pooling noted  Fetal Monitoring -Continuous EFM/Toco since VB this morning at 8AM- Cat I tracing baseline 140 bpm mod var +accels 15x15s, no decels -Tocometry:some mild irritability noted initially at 8AM but resolved within 30 min and no ctxs   [place] (in office) 12/28: EFW 33% transverse lie, complete previa, AFI 17cm, CL 4.6cm  A/P: Katherine Schultz 26 y.o. G2P1001 at [redacted]w[redacted]d HD#3 admitted with 3rd episode of VB in setting of complete placenta previa, now with recurrent VB episode this morning  Patient seen and evaluated at bedside around 8AM, 12PM, and 5PM today due to VB and  cramping. After first episode patient was placed NPO, given scheduled Procardia early, and given IVF bolus hydration. She continued to have some light bright red spotting on tissue with wiping in bathroom but no further gushes or clots. Throughout the course of the day she reported increased lower pelvic cramping, described as period cramps. She also reported feeling of pelvic pressure- as if there was something that needed to come out when she used the bathroom. No rectal pressure. No contractions or tightening. Patient reported previous response to Procardia when she had cramping in the past but no response today. Speculum exam performed to rule out labor, although low suspicion given absence of contractions on monitor and no further bleeding. Speculum exam with limited visualization but clot removed ~half dollar size, no active bleeding noted, and cervix appeared to be closed and very posterior. Patient reassured. Will keep on continuous monitoring but allow patient to eat dinner since no further VB since 8AM. Patient and husband aware to call RN if any contraction pain or VB.  Placenta Previa -Continue pelvic rest -Pad monitoring -Scheduled Procardia -Continuous EFM/Toco: Cat I Prematurity -S/p Rescue course BMZ 1/4-1/5, first course 11/27-11/28 Antepartum care -SCD VTE ppx -Routine inpatient care   Drisana Schweickert A Avril Busser 04/29/22 5:23 PM

## 2022-04-30 LAB — TYPE AND SCREEN
ABO/RH(D): O POS
Antibody Screen: NEGATIVE

## 2022-04-30 NOTE — Progress Notes (Signed)
Katherine Schultz 27 y.o. G2P1001 at [redacted]w[redacted]d HD#4 admitted with 3rd episode of VB in setting of complete placenta previa   S: Patient doing well today, Denies any further VB since yesterday morning episode. The spotting that followed subsided last night and has seen nothing in the toilet or with wiping this morning. She had increased lower abdominal cramping all throughout the day yesterday but was not having contractions. After being taken off continuous FHR monitoring last night patient states baby settled down and felt more comfortable and was able to sleep soundly through the night. This morning, attributes lower pelvic soreness to baby's movements but denies any painful tightening. Staying hydrated and eating well, no N/V. Urinating without difficulties. No constipation. No new concerns  O: Vitals:   04/29/22 2325 04/30/22 0405 04/30/22 0617 04/30/22 0820  BP:  (!) 104/59  109/60  Pulse:  98  93  Resp:  17  17  Temp:  98.1 F (36.7 C)  98.2 F (36.8 C)  TempSrc:  Oral  Oral  SpO2: 99% 98%  99%  Weight:   84.4 kg   Height:       Exam -General: AAO, NAD -Abdomen: gravid uterus soft, no palpable contractions, no fundal tenderness -Extremities: SCDs on and cycling, no LE edema  Fetal Monitoring -1/7 AM NST reactive baseline 140 bpm mod var +accels, -decels. Acontractile on Toco  [place] (in office) 12/28: EFW 33% transverse lie, complete previa, AFI 17cm, CL 4.6cm  A/P: Katherine Schultz 27 y.o. G2P1001 at [redacted]w[redacted]d HD#4 admitted with 3rd episode of VB in setting of complete placenta previa, clinically stable  Placenta Previa -Last VB episode yesterday 1/6 AM none today -Continue pelvic rest and modified bed rest. Discussed ok for wheelchair privileges to lobby to visit [place] son -Scheduled Procardia -NST q shift Prematurity -S/p Rescue course BMZ 1/4-1/5, first course 11/27-11/28 Antepartum care -Bowel regimen PRN -Regular diet -SCD VTE ppx -Continue routine inpatient care.  Discussed with patient given recurrent bleed yesterday since this admission and currently on admission #3 for VB in setting of placenta previa, likely plan for inpatient care until delivery with goal delivery of 36w GA, MOD by cesarean. Patient understanding of this recommendation and agrees to plan of care   Curtiss Mahmood A Sahith Nurse 04/30/22 12:31 PM

## 2022-05-01 NOTE — Progress Notes (Signed)
Katherine Schultz 27 y.o. G2P1001 at [redacted]w[redacted]d HD#5 admitted with 3rd episode of VB in setting of complete placenta previa   S: Patient doing well today, Denies any further VB since saturday morning episode. Brown dc with wiping this morning. She had increased lower abdominal cramping all throughout the day saturday but was not having contractions. After being taken off continuous FHR monitoring , patient states baby settled down and felt more comfortable and was able to sleep soundly through the night. Urinating without difficulties. No constipation. No new concerns  O: Vitals:   04/30/22 1940 04/30/22 2254 05/01/22 0611 05/01/22 0828  BP: 123/75 106/71 101/60 114/76  Pulse: (!) 101 100 96 93  Resp: 15 18 15 16   Temp: 98.4 F (36.9 C) 98.2 F (36.8 C) 98.1 F (36.7 C) 98.2 F (36.8 C)  TempSrc: Oral Oral Oral Oral  SpO2: 100% 100% 100% 100%  Weight:      Height:       Exam -General: AAO, NAD -Lungs CTA -CV RRR -Abdomen: gravid uterus soft, no palpable contractions, no fundal tenderness -Extremities: SCDs on and cycling, no LE edema -Neuro: non focal -Skin: intact  Fetal Monitoring -1/7 1800 NST reactive baseline 140 bpm mod var +accels, -decels. Acontractile on Toco -1/7 2200- NST reactive baseline 145 bpm mod var +accels, -decels. Acontractile on Toco. Occ UI -1/8 1000 NST reactive baseline 140 -150 bpm mod var +accels, -decels. Acontractile on Toco  [place] (in office) 12/28: EFW 33% transverse lie, complete previa, AFI 17cm, CL 4.6cm  A/P: Katherine Schultz 27 y.o. G2P1001 at 3210d HD#5 admitted with 3rd episode of VB in setting of complete placenta previa, clinically stable  Placenta Previa -Last VB episode 1/6 am, just brown dc today -Continue pelvic rest and modified bed rest. Discussed ok for wheelchair privileges  -Scheduled Procardia -NST q shift Prematurity -S/p Rescue course BMZ 1/4-1/5, first course 11/27-11/28  -Continue routine inpatient care. Discussed  with patient given recurrent bleed saturday since this admission and currently on admission #3 for VB in setting of placenta previa, likely plan for inpatient care until delivery with goal delivery of 36w GA, MOD by cesarean. Patient understanding of this recommendation and agrees to plan of care. Dr Lanny Cramp to schedule csection per pt.   Lourine Alberico J 05/01/22 10:22 AM

## 2022-05-02 NOTE — Progress Notes (Signed)
27 y.o. G2P1001 at [redacted]w[redacted]d HD#6 admitted with 3rd episode of VB in setting of complete placenta previa    Feels fine; no bleeding; no ctx, no lof; +fm Sometimes hard to note fetal movement from cramping, does get anxious some about this In general notices a constant dull sensation in low pelvis that she has had throughout the admission  Patient Vitals for the past 24 hrs:  BP Temp Temp src Pulse Resp SpO2  05/02/22 1144 108/66 98.4 F (36.9 C) Oral (!) 105 16 99 %  05/02/22 0808 98/66 -- -- (!) 112 -- --  05/01/22 2347 108/68 98.1 F (36.7 C) Oral 94 15 100 %  05/01/22 1726 113/75 98.4 F (36.9 C) Oral 98 -- 99 %   A&ox3 Nml respirations Abd: soft,nd,nt, gravid LE: no edema, nt bilat  NST 1648 1/8 Fht:130s, nml variability, +accels, no decels Toco: no ctx   2244 1/8  FHT: 120s, nml variability, +accels, no decels Toco: no tcs  0800 1/9 FHT: 150s then baseline change to 130s, nml variability, +accels, no decels Toco: no ctx  A/P: 27 y.o. G2P1001 at [redacted]w[redacted]d HD#4 admitted with 3rd episode of VB in setting of complete placenta previa, clinically stable   Placenta Previa -Last VB episode 1/6 AM none since -Continue pelvic rest and modified bed rest. Discussed ok for wheelchair privileges to lobby to visit [place] son -Scheduled Procardia -NST q shift Prematurity -S/p Rescue course BMZ 1/4-1/5, first course 11/27-11/28 Antepartum care -Bowel regimen PRN -Regular diet -SCD VTE ppx -Continue routine inpatient care. Discussed with patient given recurrent bleed yesterday since this admission and currently on admission #3 for VB in setting of placenta previa,  plan for inpatient care until delivery with goal delivery of 36w GA, MOD by cesarean. Patient understanding of this recommendation and agrees to plan of care

## 2022-05-03 LAB — TYPE AND SCREEN
ABO/RH(D): O POS
Antibody Screen: NEGATIVE

## 2022-05-03 NOTE — Progress Notes (Addendum)
HD # 7 Placenta Previa, 3rd bleed 32.4 wks  S: No bleeding. Some lower belly pressure and tightness, baby feels low but no UCs pain. No LOF. No constipation, Metamucil helps   O:  BP 103/74 (BP Location: Left Arm)   Pulse (!) 112   Temp 98 F (36.7 C) (Oral)   Resp 16   Ht 5\' 6"  (1.676 m)   Wt 84.4 kg   SpO2 98%   BMI 30.02 kg/m   Patient Vitals for the past 24 hrs:  BP Temp Temp src Pulse Resp SpO2  05/03/22 0628 103/74 98 F (36.7 C) Oral (!) 112 16 98 %  05/02/22 2303 124/81 97.6 F (36.4 C) Oral (!) 117 17 98 %  05/02/22 2003 124/72 98.2 F (36.8 C) Oral (!) 135 16 100 %  05/02/22 1557 115/68 98.5 F (36.9 C) Oral 99 16 99 %  05/02/22 1144 108/66 98.4 F (36.9 C) Oral (!) 105 16 99 %  05/02/22 0808 98/66 -- -- (!) 112 -- --    Physical exam:  A&O x 3, no acute distress. Pleasant HEENT neg, no thyromegaly Lungs CTA bilat CV RRR, S1S2 normal Abdo soft, non tender, non acute Extr no edema/ tenderness Pelvic Defer, no vag bleeding reported   NST 1/9 3 pm  130s mod variability + accels no decels , no UCs.- reactive   NST 1/9 10 pm 140s mod variability + accels no decels , no UCs.- reactive   NST 1/10 140s mod variab, + accels no decels , no UCs, reactive    A/P: 27 y.o. G2P1001 at 32w4 d HD# 7, complete placenta previa with 3rd bleed.    Placenta Previa -Last VB episode 1/6 AM none since -Continue pelvic rest and modified bed rest.  -Ok for wheelchair privileges to lobby to visit 31 yo son -Scheduled Procardia -NST q shift Prematurity -S/p Rescue course BMZ 1/4-1/5, first course 11/27-11/28 Antepartum care -Bowel regimen PRN -Regular diet -SCD VTE ppx -Continue inpatient care due to recurrent bleeding with previa, now 3rd episode.  - Goal for delivery of 36wks by Cesarean. Patient understanding of this recommendation and agrees to plan of care

## 2022-05-04 ENCOUNTER — Inpatient Hospital Stay (HOSPITAL_BASED_OUTPATIENT_CLINIC_OR_DEPARTMENT_OTHER): Payer: Managed Care, Other (non HMO)

## 2022-05-04 DIAGNOSIS — O4413 Placenta previa with hemorrhage, third trimester: Secondary | ICD-10-CM | POA: Diagnosis not present

## 2022-05-04 DIAGNOSIS — Z3A32 32 weeks gestation of pregnancy: Secondary | ICD-10-CM

## 2022-05-04 NOTE — Plan of Care (Signed)
  Problem: Elimination: Goal: Will not experience complications related to bowel motility Outcome: Completed/Met   Problem: Clinical Measurements: Goal: Respiratory complications will improve Outcome: Not Applicable Goal: Cardiovascular complication will be avoided Outcome: Not Applicable

## 2022-05-04 NOTE — Progress Notes (Signed)
Katherine Schultz 27 y.o. G2P1001 at [redacted]w[redacted]d HD#8 admitted with 3rd episode of VB in setting of complete placenta previa   S: Patient had another VB episode this morning around 3AM. She reports a gush into the toilet when she went to the bathroom, followed by a moderate size clot, and smaller gush right after clot passed. States clot went to the bottom of the toilet unable to see exact size. States this bleed was less than the last bleed she had on Saturday (5 days prior). Did have some mild cramping after bleeding but rating a 3/10 pain scale and describes as more irritable than painful. Good FM throughout. Patient has been up to the bathroom several times since and seeing only blood on tissue with wiping, not collecting in toilet or on the pad. No large gush or clots. Patient has been NPO since bleed at 3AM this morning. Otherwise had been feeling well.  Baby girl "[name]" active  O: Vitals:   05/03/22 2003 05/03/22 2359 05/04/22 0352 05/04/22 0914  BP: 118/66 (!) 99/58 117/76 122/88  Pulse: (!) 102  (!) 110 (!) 115  Resp: 18 19 19 18   Temp: 97.6 F (36.4 C)  97.8 F (36.6 C) 98 F (36.7 C)  TempSrc: Oral  Oral Oral  SpO2: 99% 98% 99% 98%  Weight:      Height:       Exam -General: AAO, NAD -Abdomen: gravid uterus soft, no palpable contractions, no fundal tenderness -Extremities: SCDs on and cycling, no LE edema  Fetal Monitoring -1/10 PM NST reactive baseline 130 bpm mod var +accels, -decels. Acontractile -1/11 Continuous since 3AM Category I tracing baseline btw 130-140 bpm moderate variability +accels, -decels. Intermittent periods of uterine irritability on Toco, no regular ctxs  [place] (in office) 12/28: EFW 33% transverse lie, complete previa, AFI 17cm, CL 4.6cm  A/P: Katherine Schultz 27 y.o. G2P1001 at [redacted]w[redacted]d HD#4 admitted with 3rd episode of VB in setting of complete placenta previa  Placenta Previa -Now with 2nd recurrence of VB episode since admission. No active VB  currently, will cont close monitoring  -Placed on NPO but if no active bleeding continues for 12hrs then ok to resume normal diet -Placed on continuous EFM/Toco since bleed- remains Cat I without regular contractions, ok to resume NST q shift if no active bleed x 12hrs as well -Continue pelvic rest and modified bed rest -Scheduled Procardia Prematurity -S/p Rescue course BMZ 1/4-1/5, first course 11/27-11/28 Antepartum care -Bowel regimen PRN -SCD VTE ppx -Continue routine inpatient care. We discussed goal of 36w for delivery by primary cesarean for placenta previa. However if VB episodes recurrent or frequent, timing may change. If VB does not recur, we discussed delivery timing details. She is aware I am not available on 2/3 at 36.0 for delivery and reviewed risks of waiting until 2/5 for delivery including VB and need for urgent/emergent delivery. Patient will discuss with husband when he returns and revisit cesarean scheduling.    Akiyah Eppolito A Milo Solana 05/04/22 12:53 PM

## 2022-05-05 DIAGNOSIS — Z3A32 32 weeks gestation of pregnancy: Secondary | ICD-10-CM

## 2022-05-05 DIAGNOSIS — O4413 Placenta previa with hemorrhage, third trimester: Principal | ICD-10-CM

## 2022-05-05 NOTE — Progress Notes (Signed)
Initial Nutrition Assessment  DOCUMENTATION CODES:  Not applicable  INTERVENTION:  Regular Diet Pt may order double protein portions and snacks TID if she makes request when ordering meals   NUTRITION DIAGNOSIS:   Increased nutrient needs related to  (pregnancy and fetal growth requirements) as evidenced by  (32 weeks IUP).  GOAL:   Patient will meet greater than or equal to 90% of their needs  MONITOR:   Weight trends  REASON FOR ASSESSMENT:   Antenatal, LOS   ASSESSMENT:   Now 32 6/7 weeks IUP, adm with placenta previa and bleeding. Delivery by 36 weeks planned. Pre-pregnancy weight 75 kg, BMI 26.8. Weight gain of 8.9 kg. On prenatal vits   Diet Order:   Diet Order             Diet regular Room service appropriate? Yes; Fluid consistency: Thin  Diet effective now                   EDUCATION NEEDS:   No education needs have been identified at this time  Skin:  Skin Assessment: Reviewed RN Assessment  Height:   Ht Readings from Last 1 Encounters:  04/27/22 5\' 6"  (1.676 m)   Weight:   Wt Readings from Last 1 Encounters:  03/22/22 82.1 kg    Ideal Body Weight:   130 lbs  BMI:  Body mass index is 30.02 kg/m.  Estimated Nutritional Needs:   Kcal:  2400-2600  Protein:  105-115 g  Fluid:  > 2.4 L

## 2022-05-05 NOTE — Consult Note (Signed)
Maternal-Fetal Medicine   Name: Katherine Schultz DOB: [birth year] MRN: [id number] Referring Provider: Brien Few, MD  I had the pleasure of seeing Katherine Schultz at the [facility]. She is G2 P1001 at [place] 6d gestation and was admitted on 04/27/22 with vaginal bleeding.  Patient had received betamethasone. This is her third episode of bleeding and she was admitted after her second episode on 03/22/22 for a week.  Obstetrical history significant for a term vaginal delivery in 2021 of a female infant weighing 3,345 g at birth.  Her pregnancy was complicated by gestational hypertension and she had induction of labor at [redacted] weeks gestation.  Past medical history: Wolff-Parkinson-White syndrome.  Patient had an episode of tachycardia in a previous pregnancy but none in this pregnancy.  She has not had ablation procedures.  No history of diabetes or hypertension or any other medical conditions. Past surgical history: Nil of note. Medications: Prenatal vitamins, nifedipine 10 mg every 6 hourly (tocolysis). Allergies: No known drug allergies. Social history: Denies tobacco or drug or alcohol use.  She has been married for years and her husband is in good health. Family history: No history of venous thromboembolism in the family.  Prenatal course: Patient had opted not to screen for fetal aneuploidies.  On ultrasound performed yesterday, the fetal growth was appropriate for gestational age.  P/E: Patient is comfortably sitting up in bed; not in distress. Vitals: Stable.  Blood pressure 121/76 mmHg. HEENT: Normal Abdomen: Soft, gravid uterus; no tenderness.  Neck no pedal edema. NST is reassuring.  Labs Hemoglobin 11.8, hematocrit 36.4, WBC 8.9, platelets 179.  Blood type O positive.  Placenta previa with hemorrhage I revisited the diagnosis of placenta previa with diagrams. Patient agrees with your recommendation to be hospitalized till delivery.  Placenta previa leads to painless recurrent  vaginal bleeding and the severity of bleeding cannot be predicted.  Expectant management till 36 weeks is our goal. However, severe maternal hemorrhage can lead to emergency cesarean delivery.   If no further bleeding occurs, delivery at 36 weeks' gestation is reasonable. If significant hemorrhage occurs after 34 weeks' gestation, decision to deliver should be made by the obstetrician on call. If recurrent mild bleeding occurs after 34 weeks' gestation, delivery is likely to be recommended to avoid significant hemorrhage.   Prevention of maternal complications outweigh neonatal benefits especially after 34 weeks.  Patient informed that she will accept obstetrician's recommendations when a non-urgent delivery is recommended.  I counseled the patient that even though there is no evidence of placenta accreta spectrum on ultrasound, focal accreta will be evident only at surgery. She informed that she will accept blood transfusions if indicated.  Recommendations -Continue inpatient management. -Nifedipine may be discontinued at 34 weeks. -Weekly BPP next week. -Delivery at 36 weeks' gestation if bleeding is very minimal or no bleeding occurs. -If frequent intermittent bleeding occurs, delivery may be considered anytime after 34 weeks.  Thank you for consultation.  If you have any questions or concerns, please contact me the [facility].  Consultation including face-to-face counseling 45 minutes.

## 2022-05-05 NOTE — Progress Notes (Addendum)
Katherine Schultz 27 y.o. G2P1001 at [redacted]w[redacted]d HD#9 admitted with 3rd episode of VB in setting of complete placenta previa   S: New onset large blood clot this am now resolved. Good FM. No LOF or pain. Occ cramping on procardia. Urinating without difficulty O: Vitals:   05/04/22 2327 05/05/22 0615 05/05/22 0815 05/05/22 1233  BP: 109/67 109/65 110/68 117/76  Pulse: 96 91 (!) 106 (!) 108  Resp: 16 16 16 16   Temp: 97.9 F (36.6 C)  98 F (36.7 C) 98.5 F (36.9 C)  TempSrc: Oral  Oral Oral  SpO2: 99% 98% 99% 99%  Weight:      Height:       Exam -General: AAO, NAD -Lungs CTA -CV RRR -Abdomen: gravid uterus soft, no palpable contractions, no fundal tenderness -Extremities: SCDs on and cycling, no LE edema -Neuro: non focal -Skin: intact  Fetal Monitoring -1/11 1800 NST reactive baseline 140 bpm mod var +accels, -decels. Acontractile on Toco -1/11 2200- NST reactive baseline 145 bpm mod var +accels, -decels. Acontractile on Toco. Occ UI -1/12 1000 NST reactive baseline 140 -150 bpm mod var +accels, -decels. Acontractile on Toco  [place] see results noted persistent post previa, AGA, nl aif  A/P: Katherine Schultz 26 y.o. G2P1001 at [redacted]w[redacted]d HD#9 admitted with 3rd episode of VB in setting of complete placenta previa, clinically stable. Now with 2 additional bleeding episodes.  Placenta Previa -Last VB episode this am, just brown dc now -Continue pelvic rest and modified bed rest. Discussed ok for wheelchair privileges  -Scheduled Procardia -NST q shift Prematurity -S/p Rescue course BMZ 1/4-1/5, first course 11/27-11/28  -Continue routine inpatient care. Discussed with patient given recurrent bleeding since this admission and currently on admission #3 for VB in setting of placenta previa, likely plan for inpatient care until delivery with goal delivery of 34-36w GA, MOD by cesarean. Patient understanding of this recommendation and agrees to plan of care. Dr Lanny Cramp to schedule csection  per pt. Discussed with MFM. Will see for consult (shankar). 81min face to face and chart review.   Lovenia Kim 05/05/22 12:41 PM

## 2022-05-06 LAB — TYPE AND SCREEN
ABO/RH(D): O POS
Antibody Screen: NEGATIVE

## 2022-05-06 NOTE — Progress Notes (Signed)
Katherine Schultz 27 y.o. G2P1001 at [redacted]w[redacted]d HD#10 admitted with 3rd episode of VB in setting of complete placenta previa   S: Patient notes small brown d/c but no pink or red blood in the last 24 hrs. No cramping. Good FM. Pt remains on q 6hr Procardia  Pt states she has less cramping and bleeding when she rests more and prefers bed rest at this time. She is wearing SCDs.  Pt expresses concern over differing opinions regarding timing of delivery if she remains stable. Pt hopeful to stay pregnant until 36 wks. \\  Baby girl "[name]" active  O: Vitals:   05/05/22 2026 05/05/22 2226 05/06/22 0415 05/06/22 0756  BP: 117/74 119/82 (!) 104/55 (!) 102/54  Pulse: (!) 115 100 95 (!) 103  Resp: 17 17 19 16   Temp: (!) 97.5 F (36.4 C) 98.2 F (36.8 C)  98.4 F (36.9 C)  TempSrc: Oral Oral  Oral  SpO2: 100% 100% 100% 100%  Weight:      Height:       Exam -General: AAO, NAD -Abdomen: gravid uterus soft, no palpable contractions, no fundal tenderness -Extremities: SCDs on and cycling, no LE edema  Fetal Monitoring -1/12 PM NST reactive baseline 145 bpm mod var +accels, rare variable decels. Acontractile -1/13, 10 am.  145s,  bpm moderate variability +accels, rare sharp variable decels. Toco neg  [place] (in office) 12/28: EFW 33% transverse lie, complete previa, AFI 17cm, CL 4.6cm  U/s 1/11: 4'5/ 29%, post placenta previa, vtx   A/P: Katherine Schultz 27 y.o. G2P1001 at [redacted]w[redacted]d HD#10 admitted with 3rd episode of VB in setting of complete placenta previa  Placenta Previa -Now with 3rd recurrence of VB episode since admission. No active VB currently, mom and fetus stable. will cont close monitoring  -Back to q shift and prn monitoring. -Continue pelvic rest and modified bed rest -Scheduled Procardia Prematurity -S/p Rescue course BMZ 1/4-1/5, first course 11/27-11/28 Antepartum care -Bowel regimen PRN -SCD VTE ppx -Continue routine inpatient care. Will follow bleeding patterns to  determine timing of delivery. We did not commit to a plan at this time.   Ala Dach 05/06/22 12:35 PM

## 2022-05-07 NOTE — Progress Notes (Signed)
Tammi Klippel 27 y.o. G2P1001 at [redacted]w[redacted]d HD#11 admitted with 3rd episode of VB in setting of complete placenta previa   S: Patient notes no current bleeding or discharge.  Patient's notes no contractions, no cramping. Good FM. Pt remains on q 6hr Procardia  Pt states she has less cramping and bleeding when she rests more and prefers bed rest at this time. She is wearing SCDs.  Pt states she feels really encouraged about the discussion yesterday regarding timing of delivery.   Pt hopeful to stay pregnant until 36 wks.  Baby girl "[name]" active  O: Vitals:   05/06/22 1714 05/06/22 2015 05/06/22 2351 05/07/22 0830  BP: 114/78 111/72 108/71 105/67  Pulse: (!) 102 (!) 111 90 (!) 102  Resp: 16 17 16 16   Temp: 98 F (36.7 C) 98.2 F (36.8 C) 97.8 F (36.6 C) 98 F (36.7 C)  TempSrc: Oral Oral Oral Oral  SpO2: 98% 99% 100% 98%  Weight:      Height:       Exam -General: AAO, NAD -Abdomen: gravid uterus soft, no palpable contractions, no fundal tenderness -Extremities: SCDs on and cycling, no LE edema  Fetal Monitoring -1/13 PM NST reactive baseline 145 bpm mod var +accels, rare variable decels. Acontractile -1/14 hello, 10 am.  145s,  bpm moderate variability +accels, rare sharp variable decels. Toco neg  [place] (in office) 12/28: EFW 33% transverse lie, complete previa, AFI 17cm, CL 4.6cm  U/s 1/11: 4'5/ 29%, post placenta previa, vtx   A/P: Tammi Klippel 27 y.o. G2P1001 at [redacted]w[redacted]d HD#10 admitted with 3rd episode of VB in setting of complete placenta previa  Placenta Previa -Now with 3rd recurrence of VB episode since admission. No active VB currently, mom and fetus stable. will cont close monitoring  -Back to q shift and prn monitoring. -Continue pelvic rest and modified bed rest -Scheduled Procardia Prematurity -S/p Rescue course BMZ 1/4-1/5, first course 11/27-11/28 Antepartum care -Bowel regimen PRN -SCD VTE ppx -Continue routine inpatient care. Will follow  bleeding patterns to determine timing of delivery. We did not commit to a plan at this time.   Ala Dach 05/07/22 11:15 AM

## 2022-05-08 NOTE — Progress Notes (Signed)
27 y.o. G2P1001 at 33.2d HD#12 admitted with 3rd episode of VB in setting of complete placenta previa   Pt w/o c/o; no vb, ctx, lof; +fm Some confusion about delivery timing/goals/what triggers need to deliver, ?5 wga ?36wga?  Patient Vitals for the past 24 hrs:  BP Temp Temp src Pulse Resp SpO2  05/07/22 2319 105/62 97.9 F (36.6 C) Oral 100 16 98 %  05/07/22 1942 113/68 98.1 F (36.7 C) Oral (!) 102 17 98 %  05/07/22 1559 121/66 98.7 F (37.1 C) Oral 100 18 98 %  05/07/22 1134 -- -- -- -- -- 98 %  05/07/22 1133 122/82 98.4 F (36.9 C) Oral (!) 106 16 98 %   A&ox3 Nml respirations Abd: soft,nt,nd; gravid LE: no edema, nt bilat  1/14 22:20 NST  FHT: 130s, nml variability, +accels, no decels TOCO: no ctx  NST 1/15 -- pending FHT: TOCO:   [place] (in office) 12/28: EFW 33% transverse lie, complete previa, AFI 17cm, CL 4.6cm   U/s 1/11: 4'5/ 29%, post placenta previa, vtx  A/P: Katherine Schultz 27 y.o. G2P1001 at [redacted]w[redacted]d HD#10 admitted with 3rd episode of VB in setting of complete placenta previa   Placenta Previa -Now with 3rd recurrence of VB episode since admission. No active VB currently, mom and fetus stable. will cont close monitoring  -Back to q shift and prn monitoring. -Continue pelvic rest and modified bed rest -Scheduled Procardia q 6 hr Prematurity -S/p Rescue course BMZ 1/4-1/5, first course 11/27-11/28 Antepartum care -Bowel regimen PRN -SCD VTE ppx -Continue routine inpatient care. Will follow bleeding patterns to determine timing of delivery. Would like to see her get to at least 77 wga, delivery by 66 wga if not delivered; bleeding (amount, etc) will determine when need to move to delivery, fluid and cannot make exact plan for what gestational age to deliver. Reviewed MFM recommendations.  Questions answered and pt feels that she has more clarity.  4. C/s for delivery   .35 min face to face, discussion of care,  counseling

## 2022-05-09 LAB — CBC
HCT: 34.9 % — ABNORMAL LOW (ref 36.0–46.0)
Hemoglobin: 11.5 g/dL — ABNORMAL LOW (ref 12.0–15.0)
MCH: 30.7 pg (ref 26.0–34.0)
MCHC: 33 g/dL (ref 30.0–36.0)
MCV: 93.1 fL (ref 80.0–100.0)
Platelets: 163 10*3/uL (ref 150–400)
RBC: 3.75 MIL/uL — ABNORMAL LOW (ref 3.87–5.11)
RDW: 13.9 % (ref 11.5–15.5)
WBC: 10.3 10*3/uL (ref 4.0–10.5)
nRBC: 0 % (ref 0.0–0.2)

## 2022-05-09 LAB — TYPE AND SCREEN
ABO/RH(D): O POS
Antibody Screen: NEGATIVE

## 2022-05-09 MED ORDER — LACTATED RINGERS IV SOLN
INTRAVENOUS | Status: DC
  Filled 2022-05-09 (×3): qty 1000

## 2022-05-09 NOTE — Progress Notes (Signed)
Katherine Schultz 27 y.o. G2P1001 at [redacted]w[redacted]d HD#13 admitted with 3rd episode of VB in setting of complete placenta previa   S: Feels well. No bleeding since 1/12.Good FM. No LOF or pain. Occ cramping on procardia. Urinating without difficulty O: Vitals:   05/08/22 1934 05/08/22 1935 05/08/22 2350 05/09/22 0749  BP:  109/71 113/67 113/72  Pulse:  (!) 105 99 (!) 110  Resp:  16 15 16   Temp:  98.2 F (36.8 C) 98.2 F (36.8 C) 98.3 F (36.8 C)  TempSrc:  Oral Oral Oral  SpO2: 99% 99% 98% 99%  Weight:      Height:       Exam -General: AAO, NAD -Lungs CTA -CV RRR -Abdomen: gravid uterus soft, no palpable contractions, no fundal tenderness -Extremities: SCDs on and cycling, no LE edema -Neuro: non focal -Skin: intact  CBC    Component Value Date/Time   WBC 8.9 04/27/2022 1804   RBC 3.86 (L) 04/27/2022 1804   HGB 11.8 (L) 04/27/2022 1804   HGB 13.7 12/14/2017 1022   HCT 36.4 04/27/2022 1804   HCT 42.4 12/14/2017 1022   PLT 179 04/27/2022 1804   PLT 217 12/14/2017 1022   MCV 94.3 04/27/2022 1804   MCV 95 12/14/2017 1022   MCH 30.6 04/27/2022 1804   MCHC 32.4 04/27/2022 1804   RDW 13.5 04/27/2022 1804   RDW 13.7 12/14/2017 1022     Fetal Monitoring -1/15 1600 NST reactive baseline 130 bpm mod var +accels, -decels. Acontractile on Toco -1/15 2100- NST reactive baseline 145 bpm mod var +accels, -decels. Acontractile on Toco. Occ UI -1/16 0900 NST reactive baseline 140 -150 bpm mod var +accels, -decels. Acontractile on Toco  [place] see results noted persistent post previa, AGA, nl afi  A/P: Katherine Schultz 27 y.o. G2P1001 at [redacted]w[redacted]d HD13 admitted with 3rd episode of VB in setting of complete placenta previa, clinically stable. Now with 2 additional bleeding episodes.  Placenta Previa -Last VB episode 1/12 -Continue pelvic rest and modified bed rest. Discussed ok for wheelchair privileges  -Scheduled Procardia -NST q shift Prematurity -S/p Rescue course BMZ 1/4-1/5,  first course 11/27-11/28  -Continue routine inpatient care. Discussed with patient given recurrent bleeding since this admission and currently on admission #3 for VB in setting of placenta previa,  plan for inpatient care until delivery with goal delivery of 34-36w GA, MOD by cesarean. Patient understanding of this recommendation and agrees to plan of care. Discussed with MFM.(See consult- agrees) 26min face to face and chart review.   [name] J 05/09/22 9:50 AM

## 2022-05-10 ENCOUNTER — Other Ambulatory Visit: Payer: Self-pay

## 2022-05-10 ENCOUNTER — Inpatient Hospital Stay (HOSPITAL_COMMUNITY): Payer: Managed Care, Other (non HMO) | Admitting: Anesthesiology

## 2022-05-10 ENCOUNTER — Encounter (HOSPITAL_COMMUNITY): Payer: Self-pay | Admitting: Obstetrics

## 2022-05-10 ENCOUNTER — Encounter (HOSPITAL_COMMUNITY)
Admission: AD | Disposition: A | Discharge status: Home / Self Care | Payer: Self-pay | Source: Home / Self Care | Attending: Obstetrics

## 2022-05-10 DIAGNOSIS — Z3A33 33 weeks gestation of pregnancy: Secondary | ICD-10-CM

## 2022-05-10 DIAGNOSIS — D500 Iron deficiency anemia secondary to blood loss (chronic): Secondary | ICD-10-CM

## 2022-05-10 DIAGNOSIS — O4413 Placenta previa with hemorrhage, third trimester: Secondary | ICD-10-CM

## 2022-05-10 DIAGNOSIS — O9902 Anemia complicating childbirth: Secondary | ICD-10-CM

## 2022-05-10 DIAGNOSIS — Z98891 History of uterine scar from previous surgery: Secondary | ICD-10-CM

## 2022-05-10 LAB — CBC
HCT: 29.3 % — ABNORMAL LOW (ref 36.0–46.0)
Hemoglobin: 9.9 g/dL — ABNORMAL LOW (ref 12.0–15.0)
MCH: 31.3 pg (ref 26.0–34.0)
MCHC: 33.8 g/dL (ref 30.0–36.0)
MCV: 92.7 fL (ref 80.0–100.0)
Platelets: 120 10*3/uL — ABNORMAL LOW (ref 150–400)
RBC: 3.16 MIL/uL — ABNORMAL LOW (ref 3.87–5.11)
RDW: 13.8 % (ref 11.5–15.5)
WBC: 7.7 10*3/uL (ref 4.0–10.5)
nRBC: 0 % (ref 0.0–0.2)

## 2022-05-10 LAB — DIC (DISSEMINATED INTRAVASCULAR COAGULATION)PANEL
D-Dimer, Quant: >20 ug/mL-FEU — ABNORMAL HIGH (ref 0.00–0.50)
Fibrinogen: 205 mg/dL — ABNORMAL LOW (ref 210–475)
INR: 1.4 — ABNORMAL HIGH (ref 0.8–1.2)
Platelets: 126 10*3/uL — ABNORMAL LOW (ref 150–400)
Prothrombin Time: 16.5 seconds — ABNORMAL HIGH (ref 11.4–15.2)
Smear Review: NONE SEEN
aPTT: 37 seconds — ABNORMAL HIGH (ref 24–36)

## 2022-05-10 SURGERY — Surgical Case
Anesthesia: Spinal

## 2022-05-10 MED ORDER — IBUPROFEN 600 MG PO TABS: 600.0000 mg | ORAL_TABLET | Freq: Four times a day (QID) | ORAL | Status: DC

## 2022-05-10 MED ORDER — COCONUT OIL OIL: 1.0000 | TOPICAL_OIL | Status: DC | PRN

## 2022-05-10 MED ORDER — KETOROLAC TROMETHAMINE 30 MG/ML IJ SOLN
INTRAMUSCULAR | Status: AC
  Filled 2022-05-10: qty 1

## 2022-05-10 MED ORDER — WITCH HAZEL-GLYCERIN EX PADS: 1.0000 | MEDICATED_PAD | CUTANEOUS | Status: DC | PRN

## 2022-05-10 MED ORDER — OXYTOCIN-SODIUM CHLORIDE 30-0.9 UT/500ML-% IV SOLN: 2.5000 [IU]/h | INTRAVENOUS | Status: AC

## 2022-05-10 MED ORDER — SOD CITRATE-CITRIC ACID 500-334 MG/5ML PO SOLN
ORAL | Status: AC
  Administered 2022-05-10: 30 mL
  Filled 2022-05-10: qty 30

## 2022-05-10 MED ORDER — METOCLOPRAMIDE HCL 5 MG/ML IJ SOLN
INTRAMUSCULAR | Status: AC
  Filled 2022-05-10: qty 2

## 2022-05-10 MED ORDER — MENTHOL 3 MG MT LOZG: 1.0000 | LOZENGE | OROMUCOSAL | Status: DC | PRN

## 2022-05-10 MED ORDER — SOD CITRATE-CITRIC ACID 500-334 MG/5ML PO SOLN: 30.0000 mL | Freq: Once | ORAL | Status: AC

## 2022-05-10 MED ORDER — FENTANYL CITRATE (PF) 100 MCG/2ML IJ SOLN
INTRAMUSCULAR | Status: DC | PRN
  Administered 2022-05-10: 15 ug via INTRATHECAL

## 2022-05-10 MED ORDER — SIMETHICONE 80 MG PO CHEW
80.0000 mg | CHEWABLE_TABLET | ORAL | Status: DC | PRN
  Administered 2022-05-11: 80 mg via ORAL
  Filled 2022-05-10: qty 1

## 2022-05-10 MED ORDER — ONDANSETRON HCL 4 MG/2ML IJ SOLN
INTRAMUSCULAR | Status: AC
  Filled 2022-05-10: qty 2

## 2022-05-10 MED ORDER — TRANEXAMIC ACID-NACL 1000-0.7 MG/100ML-% IV SOLN
INTRAVENOUS | Status: AC
  Filled 2022-05-10: qty 100

## 2022-05-10 MED ORDER — OXYTOCIN-SODIUM CHLORIDE 30-0.9 UT/500ML-% IV SOLN
INTRAVENOUS | Status: DC | PRN
  Administered 2022-05-10: 300 mL via INTRAVENOUS

## 2022-05-10 MED ORDER — ONDANSETRON HCL 4 MG/2ML IJ SOLN
INTRAMUSCULAR | Status: DC | PRN
  Administered 2022-05-10: 4 mg via INTRAVENOUS

## 2022-05-10 MED ORDER — FENTANYL CITRATE (PF) 100 MCG/2ML IJ SOLN: 25.0000 ug | INTRAMUSCULAR | Status: DC | PRN

## 2022-05-10 MED ORDER — DEXAMETHASONE SODIUM PHOSPHATE 10 MG/ML IJ SOLN
INTRAMUSCULAR | Status: DC | PRN
  Administered 2022-05-10: 3 mg via INTRAVENOUS
  Administered 2022-05-10: 5 mg via INTRAVENOUS

## 2022-05-10 MED ORDER — CEFAZOLIN SODIUM-DEXTROSE 2-3 GM-%(50ML) IV SOLR
INTRAVENOUS | Status: DC | PRN
  Administered 2022-05-10: 2 g via INTRAVENOUS

## 2022-05-10 MED ORDER — ACETAMINOPHEN 500 MG PO TABS
1000.0000 mg | ORAL_TABLET | Freq: Four times a day (QID) | ORAL | Status: DC
  Administered 2022-05-10 – 2022-05-13 (×11): 1000 mg via ORAL
  Filled 2022-05-10 (×13): qty 2

## 2022-05-10 MED ORDER — DIPHENHYDRAMINE HCL 25 MG PO CAPS: 25.0000 mg | ORAL_CAPSULE | Freq: Four times a day (QID) | ORAL | Status: DC | PRN

## 2022-05-10 MED ORDER — PHENYLEPHRINE HCL-NACL 20-0.9 MG/250ML-% IV SOLN
INTRAVENOUS | Status: DC | PRN
  Administered 2022-05-10: 60 ug/min via INTRAVENOUS

## 2022-05-10 MED ORDER — LACTATED RINGERS IV BOLUS
500.0000 mL | Freq: Once | INTRAVENOUS | Status: AC
  Administered 2022-05-10: 500 mL via INTRAVENOUS

## 2022-05-10 MED ORDER — KETOROLAC TROMETHAMINE 30 MG/ML IJ SOLN: 30.0000 mg | Freq: Once | INTRAMUSCULAR | Status: DC | PRN

## 2022-05-10 MED ORDER — LACTATED RINGERS IV SOLN
INTRAVENOUS | Status: DC
  Filled 2022-05-10 (×3): qty 1000

## 2022-05-10 MED ORDER — STERILE WATER FOR IRRIGATION IR SOLN
Status: DC | PRN
  Administered 2022-05-10: 1000 mL

## 2022-05-10 MED ORDER — SIMETHICONE 80 MG PO CHEW
80.0000 mg | CHEWABLE_TABLET | Freq: Three times a day (TID) | ORAL | Status: DC
  Administered 2022-05-11 – 2022-05-13 (×7): 80 mg via ORAL
  Filled 2022-05-10 (×7): qty 1

## 2022-05-10 MED ORDER — DEXAMETHASONE SODIUM PHOSPHATE 4 MG/ML IJ SOLN
INTRAMUSCULAR | Status: AC
  Filled 2022-05-10: qty 2

## 2022-05-10 MED ORDER — PHENYLEPHRINE 80 MCG/ML (10ML) SYRINGE FOR IV PUSH (FOR BLOOD PRESSURE SUPPORT)
PREFILLED_SYRINGE | INTRAVENOUS | Status: DC | PRN
  Administered 2022-05-10: 160 ug via INTRAVENOUS
  Administered 2022-05-10: 80 ug via INTRAVENOUS

## 2022-05-10 MED ORDER — FENTANYL CITRATE (PF) 100 MCG/2ML IJ SOLN
INTRAMUSCULAR | Status: AC
  Filled 2022-05-10: qty 2

## 2022-05-10 MED ORDER — MORPHINE SULFATE (PF) 0.5 MG/ML IJ SOLN
INTRAMUSCULAR | Status: DC | PRN
  Administered 2022-05-10: 150 ug via INTRATHECAL

## 2022-05-10 MED ORDER — OXYCODONE HCL 5 MG PO TABS: 5.0000 mg | ORAL_TABLET | Freq: Once | ORAL | Status: DC | PRN

## 2022-05-10 MED ORDER — PHENYLEPHRINE 80 MCG/ML (10ML) SYRINGE FOR IV PUSH (FOR BLOOD PRESSURE SUPPORT)
PREFILLED_SYRINGE | INTRAVENOUS | Status: AC
  Filled 2022-05-10: qty 10

## 2022-05-10 MED ORDER — MORPHINE SULFATE (PF) 0.5 MG/ML IJ SOLN
INTRAMUSCULAR | Status: AC
  Filled 2022-05-10: qty 10

## 2022-05-10 MED ORDER — METHYLERGONOVINE MALEATE 0.2 MG/ML IJ SOLN
0.2000 mg | Freq: Once | INTRAMUSCULAR | Status: AC
  Administered 2022-05-10: 0.2 mg via INTRAMUSCULAR

## 2022-05-10 MED ORDER — METHYLERGONOVINE MALEATE 0.2 MG/ML IJ SOLN
INTRAMUSCULAR | Status: AC
  Filled 2022-05-10: qty 1

## 2022-05-10 MED ORDER — OXYCODONE HCL 5 MG/5ML PO SOLN: 5.0000 mg | Freq: Once | ORAL | Status: DC | PRN

## 2022-05-10 MED ORDER — DIBUCAINE (PERIANAL) 1 % EX OINT: 1.0000 | TOPICAL_OINTMENT | CUTANEOUS | Status: DC | PRN

## 2022-05-10 MED ORDER — OXYCODONE HCL 5 MG PO TABS
5.0000 mg | ORAL_TABLET | ORAL | Status: DC | PRN
  Administered 2022-05-11 – 2022-05-12 (×2): 10 mg via ORAL
  Filled 2022-05-10 (×2): qty 2

## 2022-05-10 MED ORDER — HYDROMORPHONE HCL 1 MG/ML IJ SOLN: 0.2000 mg | INTRAMUSCULAR | Status: DC | PRN

## 2022-05-10 MED ORDER — TRANEXAMIC ACID-NACL 1000-0.7 MG/100ML-% IV SOLN
1000.0000 mg | Freq: Once | INTRAVENOUS | Status: AC
  Administered 2022-05-10: 1000 mg via INTRAVENOUS

## 2022-05-10 MED ORDER — ZOLPIDEM TARTRATE 5 MG PO TABS: 5.0000 mg | ORAL_TABLET | Freq: Every evening | ORAL | Status: DC | PRN

## 2022-05-10 MED ORDER — KETOROLAC TROMETHAMINE 30 MG/ML IJ SOLN
30.0000 mg | Freq: Four times a day (QID) | INTRAMUSCULAR | Status: AC
  Administered 2022-05-10 – 2022-05-11 (×4): 30 mg via INTRAVENOUS
  Filled 2022-05-10 (×4): qty 1

## 2022-05-10 MED ORDER — PROMETHAZINE HCL 25 MG/ML IJ SOLN
INTRAMUSCULAR | Status: AC
  Filled 2022-05-10: qty 1

## 2022-05-10 MED ORDER — ACETAMINOPHEN 10 MG/ML IV SOLN
INTRAVENOUS | Status: DC | PRN
  Administered 2022-05-10: 1000 mg via INTRAVENOUS

## 2022-05-10 MED ORDER — PHENYLEPHRINE HCL-NACL 20-0.9 MG/250ML-% IV SOLN
INTRAVENOUS | Status: AC
  Filled 2022-05-10: qty 250

## 2022-05-10 MED ORDER — OXYTOCIN-SODIUM CHLORIDE 30-0.9 UT/500ML-% IV SOLN
INTRAVENOUS | Status: AC
  Filled 2022-05-10: qty 500

## 2022-05-10 MED ORDER — PROMETHAZINE HCL 25 MG/ML IJ SOLN
6.2500 mg | INTRAMUSCULAR | Status: DC | PRN
  Administered 2022-05-10: 6.25 mg via INTRAVENOUS

## 2022-05-10 MED ORDER — SCOPOLAMINE 1 MG/3DAYS TD PT72
MEDICATED_PATCH | TRANSDERMAL | Status: DC | PRN
  Administered 2022-05-10: 1 via TRANSDERMAL

## 2022-05-10 MED ORDER — METHYLERGONOVINE MALEATE 0.2 MG PO TABS
0.2000 mg | ORAL_TABLET | Freq: Four times a day (QID) | ORAL | Status: AC
  Administered 2022-05-10 – 2022-05-11 (×4): 0.2 mg via ORAL
  Filled 2022-05-10 (×4): qty 1

## 2022-05-10 MED ORDER — PRENATAL MULTIVITAMIN CH
1.0000 | ORAL_TABLET | Freq: Every day | ORAL | Status: DC
  Administered 2022-05-11 – 2022-05-13 (×3): 1 via ORAL
  Filled 2022-05-10 (×3): qty 1

## 2022-05-10 MED ORDER — SODIUM CHLORIDE 0.9 % IR SOLN
Status: DC | PRN
  Administered 2022-05-10: 1000 mL

## 2022-05-10 MED ORDER — BUPIVACAINE IN DEXTROSE 0.75-8.25 % IT SOLN
INTRATHECAL | Status: DC | PRN
  Administered 2022-05-10: 1.6 mL via INTRATHECAL

## 2022-05-10 MED ORDER — METOCLOPRAMIDE HCL 5 MG/ML IJ SOLN
INTRAMUSCULAR | Status: DC | PRN
  Administered 2022-05-10: 10 mg via INTRAVENOUS

## 2022-05-10 MED ORDER — SENNOSIDES-DOCUSATE SODIUM 8.6-50 MG PO TABS
2.0000 | ORAL_TABLET | Freq: Every day | ORAL | Status: DC
  Administered 2022-05-11 – 2022-05-13 (×3): 2 via ORAL
  Filled 2022-05-10 (×3): qty 2

## 2022-05-10 MED ORDER — SODIUM CHLORIDE 0.9 % IV SOLN: INTRAVENOUS | Status: DC | PRN

## 2022-05-10 SURGICAL SUPPLY — 33 items
BENZOIN TINCTURE PRP APPL 2/3 (GAUZE/BANDAGES/DRESSINGS) IMPLANT
CHLORAPREP W/TINT 26 (MISCELLANEOUS) ×2 IMPLANT
CLAMP UMBILICAL CORD (MISCELLANEOUS) ×1 IMPLANT
CLOTH BEACON ORANGE TIMEOUT ST (SAFETY) ×1 IMPLANT
DRSG OPSITE POSTOP 4X10 (GAUZE/BANDAGES/DRESSINGS) ×1 IMPLANT
ELECT REM PT RETURN 9FT ADLT (ELECTROSURGICAL) ×1
ELECTRODE REM PT RTRN 9FT ADLT (ELECTROSURGICAL) ×1 IMPLANT
EXTRACTOR VACUUM KIWI (MISCELLANEOUS) IMPLANT
GAUZE SPONGE 4X4 12PLY STRL LF (GAUZE/BANDAGES/DRESSINGS) IMPLANT
GLOVE BIOGEL PI IND STRL 7.0 (GLOVE) ×3 IMPLANT
GLOVE ECLIPSE 6.5 STRL STRAW (GLOVE) ×1 IMPLANT
GOWN STRL REUS W/TWL LRG LVL3 (GOWN DISPOSABLE) ×2 IMPLANT
HEMOSTAT ARISTA ABSORB 3G PWDR (HEMOSTASIS) IMPLANT
KIT ABG SYR 3ML LUER SLIP (SYRINGE) IMPLANT
LIGASURE IMPACT 36 18CM CVD LR (INSTRUMENTS) ×1 IMPLANT
NDL HYPO 25X5/8 SAFETYGLIDE (NEEDLE) IMPLANT
NEEDLE HYPO 25X5/8 SAFETYGLIDE (NEEDLE) ×1 IMPLANT
NS IRRIG 1000ML POUR BTL (IV SOLUTION) ×1 IMPLANT
PACK C SECTION WH (CUSTOM PROCEDURE TRAY) ×1 IMPLANT
PAD ABD 7.5X8 STRL (GAUZE/BANDAGES/DRESSINGS) IMPLANT
PAD OB MATERNITY 4.3X12.25 (PERSONAL CARE ITEMS) ×1 IMPLANT
STRIP CLOSURE SKIN 1/2X4 (GAUZE/BANDAGES/DRESSINGS) IMPLANT
SUT MNCRL 0 VIOLET CTX 36 (SUTURE) ×2 IMPLANT
SUT MONOCRYL 0 CTX 36 (SUTURE) ×2
SUT PLAIN 0 NONE (SUTURE) IMPLANT
SUT PLAIN 2 0 (SUTURE) ×1
SUT PLAIN ABS 2-0 CT1 27XMFL (SUTURE) ×1 IMPLANT
SUT VIC AB 0 CT1 27 (SUTURE) ×1
SUT VIC AB 0 CT1 27XBRD ANBCTR (SUTURE) ×1 IMPLANT
SUT VIC AB 4-0 KS 27 (SUTURE) ×1 IMPLANT
TOWEL OR 17X24 6PK STRL BLUE (TOWEL DISPOSABLE) ×1 IMPLANT
TRAY FOLEY W/BAG SLVR 14FR LF (SET/KITS/TRAYS/PACK) IMPLANT
WATER STERILE IRR 1000ML POUR (IV SOLUTION) ×1 IMPLANT

## 2022-05-10 NOTE — Anesthesia Postprocedure Evaluation (Signed)
Anesthesia Post Note  Patient: Katherine Schultz  Procedure(s) Performed: CESAREAN SECTION     Patient location during evaluation: PACU Anesthesia Type: Spinal Level of consciousness: awake Pain management: pain level controlled Vital Signs Assessment: post-procedure vital signs reviewed and stable Respiratory status: spontaneous breathing, respiratory function stable and nonlabored ventilation Cardiovascular status: blood pressure returned to baseline and stable Postop Assessment: no headache, no backache and no apparent nausea or vomiting Anesthetic complications: no   No notable events documented.  Last Vitals:  Vitals:   05/10/22 1959 05/10/22 2102  BP: 111/82 114/79  Pulse: 97   Resp: 16 16  Temp: 37.2 C 37.4 C  SpO2: 98% 96%    Last Pain:  Vitals:   05/10/22 2102  TempSrc: Oral  PainSc: 0-No pain   Pain Goal: Patients Stated Pain Goal: 3 (05/04/22 1930)                 Nilda Simmer

## 2022-05-10 NOTE — Progress Notes (Signed)
Progress Note  26Y G2P1001 @ 33.4 with placenta previa and recurrent VB  Received call from RN that patient had a larger gush of bleeding. Patient seen and evaluated at bedside shortly thereafter. Patient back in bed resting, tearful but appears comfortable. Patient reports feeling a large gush while laying in the bed. When she got up to the bathroom she had found the pad to be saturated and felt several clots pass in the toilet followed by another gush. Did not flush toilet so I could evaluate bleeding- bright red blood fills the toilet bowel- unable to see to the bottom where clots have collected. Pad also saved- the large pad is saturated with bright red blood as well. Patient states this is the most significant bleed she has had thus far. She is having more cramping now rating it a 3/10 for pain but does not feel like contractions or painful. Good FM throughout.   This is following a smaller bleed that occurred later yesterday evening and again this morning. Those episodes of VB were reported to be small amounts. She has been on continuous EFM/Toco since those episodes occurred. Reassuring fetal status throughout. Initially placed on NPO last night, did not have any additional new bleeding overnight so was allowed to eat breakfast but then had another small episode of bleeding so returned to NPO.   EFM: cat I baseline 145 bpm mod var +accels, -dcels Toco: uterine irritability, no ctxs  Pelvic exam: current pad on checked and noted to have some superficial spotting but no active trickle or clots coming from vagina appreciated  CBC    Component Value Date/Time   WBC 10.3 05/09/2022 2137   RBC 3.75 (L) 05/09/2022 2137   HGB 11.5 (L) 05/09/2022 2137   HGB 13.7 12/14/2017 1022   HCT 34.9 (L) 05/09/2022 2137   HCT 42.4 12/14/2017 1022   PLT 163 05/09/2022 2137   PLT 217 12/14/2017 1022   MCV 93.1 05/09/2022 2137   MCV 95 12/14/2017 1022   MCH 30.7 05/09/2022 2137   MCHC 33.0 05/09/2022 2137    RDW 13.9 05/09/2022 2137   RDW 13.7 12/14/2017 1022     Patient husband and sister at bedside for review of plan of care.  We discussed goal for delivery in setting of placenta previa with recurrent VB is 34-36 weeks. However, if patient has continuous active VB, may need to proceed with delivery early. Currently VB appears to be stable, there is reassuring fetal status, and no signs of labor so will continue with expectant management for now. Close pad tracking and monitoring of VB. Continuous fetal monitoring with toco. Remain NPO and keep IVF running. Patient is s/p BMZ course and rescue course. Request for NICU consult today. Will reassess patient again this afternoon or sooner as needed.  Cesarean section consents reviewed at bedside. Patient counseled on risks of surgery including but not limited to bleeding, infection, damage to surrounding structures, VTE, and risks to subsequent pregnancies, as well as inherit risks of anesthesia. Patient verbalizes good understanding of these risks, questions answered, and consents signed at bedside. Patient also consented for blood transfusion if clinically indicated.  Ernesto Zukowski A Ivet Guerrieri 05/10/22 3:05 PM

## 2022-05-10 NOTE — Anesthesia Procedure Notes (Signed)
Spinal  Patient location during procedure: OR Start time: 05/10/2022 4:36 PM End time: 05/10/2022 4:39 PM Reason for block: surgical anesthesia Staffing Performed: anesthesiologist  Anesthesiologist: Nilda Simmer, MD Performed by: Nilda Simmer, MD Authorized by: Nilda Simmer, MD   Preanesthetic Checklist Completed: patient identified, IV checked, site marked, risks and benefits discussed, surgical consent, monitors and equipment checked, pre-op evaluation and timeout performed Spinal Block Patient position: sitting Prep: DuraPrep Patient monitoring: blood pressure and continuous pulse ox Approach: midline Location: L3-4 Injection technique: single-shot Needle Needle type: Pencan  Needle gauge: 24 G Needle length: 9 cm Additional Notes Risks and benefits of neuraxial anesthesia including, but not limited to, infection, bleeding, local anesthetic toxicity, headache, hypotension, back pain, block failure, etc. were discussed with the patient. The patient expressed understanding and consented to the procedure. I confirmed that the patient has no bleeding disorders and is not taking blood thinners. I confirmed the patient's last platelet count with the nurse. Monitors were applied. A time-out was performed immediately prior to the procedure. Sterile technique was used throughout the whole procedure.   _1__ attempt(s)

## 2022-05-10 NOTE — Transfer of Care (Signed)
Immediate Anesthesia Transfer of Care Note  Patient: Katherine Schultz  Procedure(s) Performed: CESAREAN SECTION  Patient Location: PACU  Anesthesia Type:Spinal  Level of Consciousness: awake, alert , and oriented  Airway & Oxygen Therapy: Patient Spontanous Breathing  Post-op Assessment: Report given to RN  Post vital signs: Reviewed  Last Vitals:  Vitals Value Taken Time  BP 106/78 05/10/22 1800  Temp    Pulse 78 05/10/22 1800  Resp 10 05/10/22 1800  SpO2 98 % 05/10/22 1800  Vitals shown include unvalidated device data.  Last Pain:  Vitals:   05/10/22 1356  TempSrc: Oral  PainSc:       Patients Stated Pain Goal: 3 (23/30/07 6226)  Complications: No notable events documented.

## 2022-05-10 NOTE — Progress Notes (Signed)
Katherine Schultz 26 y.o. G2P1001 at [redacted]w[redacted]d HD#14 admitted with 3rd episode of VB in setting of complete placenta previa   S: Feels well this am. No contractions, no active bleeding. Pt with bright red small amount of bleeding from 7-10 pm. No large gushes and no clots but bleeding with every time she wiped after voiding, staining, half dollar size at most of bright red blood on pad. Pt notes during this time felt crampy, like a menses, 3/10. Procardia, scheduled, did not change the cramping pain. Since 10pm pt has not changed her pain and small amount brown blood stain on pad this am. IVF and NPO and continous monitoring o/n. No CP, no SOB. No dizziness.   O: Vitals:   05/09/22 1616 05/09/22 2000 05/10/22 0529 05/10/22 0804  BP: 115/67 120/81 106/74 118/79  Pulse: 98 (!) 108 (!) 109 (!) 102  Resp: 17 18 18 17   Temp: 98.3 F (36.8 C) 98.1 F (36.7 C) 98.1 F (36.7 C) 98 F (36.7 C)  TempSrc: Oral Oral Oral Oral  SpO2: 100% 99% 98% 99%  Weight:      Height:       Exam -General: AAO, NAD  -Abdomen: gravid uterus soft, no palpable contractions, no fundal tenderness -Extremities: SCDs on and cycling, no LE edema -Neuro: non focal -Skin: intact  CBC    Component Value Date/Time   WBC 10.3 05/09/2022 2137   RBC 3.75 (L) 05/09/2022 2137   HGB 11.5 (L) 05/09/2022 2137   HGB 13.7 12/14/2017 1022   HCT 34.9 (L) 05/09/2022 2137   HCT 42.4 12/14/2017 1022   PLT 163 05/09/2022 2137   PLT 217 12/14/2017 1022   MCV 93.1 05/09/2022 2137   MCV 95 12/14/2017 1022   MCH 30.7 05/09/2022 2137   MCHC 33.0 05/09/2022 2137   RDW 13.9 05/09/2022 2137   RDW 13.7 12/14/2017 1022     Fetal Monitoring -1/16 10pmNST reactive baseline 140 bpm mod var +accels, -decels. Acontractile on Toco nut occasional irritbitlity -1/17 9 am- NST reactive baseline 140 bpm mod var +accels, -decels. Acontractile on Toco. Occ UI   A/P: Katherine Schultz 26 y.o. G2P1001 at [redacted]w[redacted]d HD14 admitted with 3rd  episode of VB in setting of complete placenta previa, clinically stable though has short term, mild bleeds every few days. On scheduled procardia. Pt remains hopeful to stay pregnant until at least 35 wks though understands risks/ benefits of expectant management.  Placenta Previa -Last VB episode 1/16. CBC stable, on iron. -Continue pelvic rest and modified bed rest. Discussed ok for wheelchair privileges  -Scheduled Procardia -NST q shift Prematurity -S/p Rescue course BMZ 1/4-1/5, first course 11/27-11/28 -will order NICU consult today  -Continue routine inpatient care. Discussed with patient given recurrent bleeding since this admission and currently on admission #3 for VB in setting of placenta previa,  plan for inpatient care until delivery with goal delivery of 34-36w GA, MOD by cesarean. Patient understanding of this recommendation and agrees to plan of care. Discussed with MFM previously.(See consult- agrees) 70min face to face and coordinating care   Ala Dach 05/10/22 9:48 AM

## 2022-05-10 NOTE — Anesthesia Preprocedure Evaluation (Signed)
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)  Rhythm:Regular Rate:Normal     Neuro/Psych negative neurological ROS     GI/Hepatic Neg liver ROS,GERD  ,,  Endo/Other  negative endocrine ROS    Renal/GU negative Renal ROS     Musculoskeletal   Abdominal   Peds  Hematology  (+) Blood dyscrasia (iron deficiency), anemia   Anesthesia Other Findings   Reproductive/Obstetrics (+) Pregnancy                             Anesthesia Physical Anesthesia Plan  ASA: 2  Anesthesia Plan: Spinal   Post-op Pain Management: Ofirmev IV (intra-op)*   Induction:   PONV Risk Score and Plan:   Airway Management Planned:   Additional Equipment:   Intra-op Plan:   Post-operative Plan:   Informed Consent: I have reviewed the patients History and Physical, chart, labs and discussed the procedure including the risks, benefits and alternatives for the proposed anesthesia with the patient or authorized representative who has indicated his/her understanding and acceptance.       Plan Discussed with: CRNA and Anesthesiologist  Anesthesia Plan Comments: (I have discussed risks of neuraxial anesthesia including but not limited to infection, bleeding, nerve injury, back pain, headache, seizures, and failure of block. Patient denies bleeding disorders and is not currently anticoagulated. Labs have  been reviewed. Risks and benefits discussed. All patient's questions answered.  )       Anesthesia Quick Evaluation

## 2022-05-10 NOTE — Progress Notes (Signed)
Katherine Schultz [birth year] [id number]  26Y G2P1102 POD#0 s/p primary c/s for bleeding placenta previa  Seen in PACU to evaluate patient bleeding. Per RN had increased clots expressed on fundal with continuous trickle. Patient had firm uterine fundus. IV Pitocin already running. She was then given IV TXA and IM Methergine x1. She continued to have some clots expressed although less each time, and each fundal check blood loss collected for QBL. Patient still comfortable from spinal anesthetic so a uterine sweep was performed which expressed some additional clots, no further trickle. Repeat labs were drawn during this time including CBC and DIC panel. Hgb drop from 11.5 last night to 9.9 now and Platelets from 163 to 120. VSS throughout and patient nausea improved with antiemetics. Total QBL in PACU and OR combined 1,322 cc and estimate an additional 200-300 cc blood loss on antepartum prior to going to OR. Most recent fundal check remained firm uterine tone and bleeding much improved, appropriate. Will start PO Methergine series for 24 hours to maintain uterine tone. Continue close monitoring and repeat CBC in AM.  Jearl Soto [name] 05/10/22 7:09 PM

## 2022-05-10 NOTE — Progress Notes (Signed)
Progress Note  26Y G2P1001 @ 33.4 with placenta previa and recurrent VB   Received another call from RN approximately 1 hour after patient last seen for last bleed. Patient with another large significant bleed. She got up and used bedside commode in order to better quantify bleeding. The large pad was again saturated and she passed several clots into the commode. A QBL was performed of this bleed which was 142 cc. Patient continues to report cramping and now feeling some increased pressure. FHR tracing remains a reassuring Category I but on Tocometry there does appear to be more uterine activity.  I discussed patient's case with on call MFM Dr. Annamaria Boots, given increased recurrent bleeding, he is in agreement with plan to proceed with delivery today.  Patient had been consented earlier today for primary cesarean section and consents signed. OR team notified  NICU for delivery, preterm at 33.4 s/p BMZ course x2 Ancef 2g abx ppx SCD VTE ppx Routine preop/intra-op care  Mylani Gentry A Loraine Freid 05/10/22 4:14 PM

## 2022-05-10 NOTE — Op Note (Signed)
Katherine Schultz Feb 01, 1996 [id number]  OPERATIVE NOTE  PROCEDURE: primary low transverse cesarean section  PRE-OPERATIVE DIAGNOSIS:  Single intrauterine pregnancy at 33 weeks 4 days Complete placenta previa Recurrent vaginal bleeding  POST-OPERATIVE DIAGNOSIS: As above, delivered  SURGEON: Langley Gauss, DO  ASSISTANT: Gavin Potters, CNM   FINDINGS: normal female gravid anatomy including uterus, bilateral fallopian tubes and ovaries. Single viable healthy female infant in the transverse lie head to maternal right spine down, delivered through hysterotomy in the frank breech presentation. Anterior placenta previa encountered  EBL: 191 cc per Triton QBL, estimate total 500 cc  FLUIDS: 2,428 cc per anesthesia   MEDICATIONS: Ancef 2 g   URINE OUTPUT:  712 cc clear  COMPLICATIONS: none  PROCEDURE IN DETAIL:  After the patient was appropriately consented she was taken to the operating room where regional anesthesia was obtained without complications. The patient was placed in the dorsal supine position with leftward tilt. Fetal heart tones were obtained and found to be reassuring. A Foley catheter was placed and the bladder was drained for clear yellow urine and remained in place for the duration of the procedure. The patient was prepped and draped in the usual sterile fashion. An appropriate time out was performed that verified the correct patient, procedure, and surgical team.   The scalpel was used to make a low transverse skin incision. The incision was carried down to the fascia, maintaining hemostasis with the Bovie as needed, The fascia was incised to the left and the right of the midline. The fascia incision was carried laterally using the curved Mayo scissors on either side. The inferior aspect of the fascia was grasped with the Kocher clamps, tented upwards, and the underlying rectus muscle dissected off bluntly and sharply with curved Mayo scissors. The Kocher clamps were  removed, placed on the superior aspect of the fascia and the rectus muscles dissected off in a similar fashion. The rectus muscles were divided at the midline bluntly. The peritoneum was identified, grasped with hemostat clamps and entered sharply with the Metzenbaum scissors. The incision was then extended laterally by bluntly stretching. The bladder blade retractor was introduced. The vesicouterine peritoneum was dissected off the lower uterine segment and a bladder flap was created digitally. The scalpel was used to make a low transverse uterine incision. The incision was extended caudad and cephalad by bluntly stretching. The amniotic membranes were ruptured for clear fluid. An anterior placenta previa was encountered. The infant was found to be in the transverse lie with head to maternal right and spine down. An attempt was made to bring the fetal head down to the hysterotomy but was not easily moving so the sacrum was grasped and easily brought down through the hysterotomy and delivered frank breech in the usual fashion. The infant was dried and stimulated. An abbreviated cord clamping was performed. Cord was double clamped and cut. Cord blood gasses were obtained. The placenta separated easily and was delivered intact by manual extraction and sent for pathology. The uterine cavity was cleared of any clot and debris. The hysterotomy was closed using 0-Monocryl in a running locking fashion. A second layer closure was performed using the same suture. Some mild oozing was noted in the left lower angle and topical hemostatic agent Arista placed. Adequate hemostasis of the hysterotomy was noted. The bilateral gutters were cleared of all clot and debris. Bilateral tubes and ovaries were inspected and found to be within normal limits. The underlying fascia planes were inspected and found to be  hemostatic. The fascia was closed with 0-Vicryl a normal running fashion. The subcutaneous layer was irrigated with sterile  saline and hemostasis obtained with the Bovie. The subcutaneous layer was closed with 2-0 plain. The skin layer was closed with 4-0 Vicryl. The patient tolerated the procedure well and was taken to the recovery room in stable condition. All instrument, needle, and sponge counts were correct.   Katherine Schultz [name] 05/10/22 6:02 PM

## 2022-05-11 ENCOUNTER — Encounter (HOSPITAL_COMMUNITY): Payer: Self-pay | Admitting: Obstetrics and Gynecology

## 2022-05-11 LAB — CBC
HCT: 24.8 % — ABNORMAL LOW (ref 36.0–46.0)
Hemoglobin: 8.5 g/dL — ABNORMAL LOW (ref 12.0–15.0)
MCH: 31.5 pg (ref 26.0–34.0)
MCHC: 34.3 g/dL (ref 30.0–36.0)
MCV: 91.9 fL (ref 80.0–100.0)
Platelets: 103 10*3/uL — ABNORMAL LOW (ref 150–400)
RBC: 2.7 MIL/uL — ABNORMAL LOW (ref 3.87–5.11)
RDW: 14 % (ref 11.5–15.5)
WBC: 9 10*3/uL (ref 4.0–10.5)
nRBC: 0 % (ref 0.0–0.2)

## 2022-05-11 MED ORDER — MAGNESIUM OXIDE -MG SUPPLEMENT 400 (240 MG) MG PO TABS
400.0000 mg | ORAL_TABLET | Freq: Every day | ORAL | Status: DC
  Administered 2022-05-11 – 2022-05-13 (×3): 400 mg via ORAL
  Filled 2022-05-11 (×3): qty 1

## 2022-05-11 MED ORDER — IBUPROFEN 600 MG PO TABS
600.0000 mg | ORAL_TABLET | Freq: Four times a day (QID) | ORAL | Status: DC
  Administered 2022-05-11 – 2022-05-13 (×7): 600 mg via ORAL
  Filled 2022-05-11 (×7): qty 1

## 2022-05-11 MED ORDER — SODIUM CHLORIDE 0.9 % IV SOLN
500.0000 mg | INTRAVENOUS | Status: DC
  Administered 2022-05-11: 500 mg via INTRAVENOUS
  Filled 2022-05-11: qty 500

## 2022-05-11 MED ORDER — SODIUM CHLORIDE 0.9 % IV SOLN
INTRAVENOUS | Status: DC | PRN
  Administered 2022-05-11: 20 mL via INTRAVENOUS

## 2022-05-11 MED ORDER — POLYSACCHARIDE IRON COMPLEX 150 MG PO CAPS
150.0000 mg | ORAL_CAPSULE | ORAL | Status: DC
  Administered 2022-05-12: 150 mg via ORAL
  Filled 2022-05-11: qty 1

## 2022-05-11 NOTE — Progress Notes (Signed)
      Subjective: POD# 1, S/P 1LTCS at 33.4 wks for bleeding previa, PPH QBL 1322cc Live born female  Birth Weight: 4 lb 11.1 oz (2130 g) APGAR: 8, 9  Newborn Delivery   Birth date/time: 05/10/2022 17:05:00 Delivery type: C-Section, Low Transverse Trial of labor: No C-section categorization: Primary     Baby name: [name] Delivering provider: LAW, CASSANDRA A   Feeding: planned breast, mom pumping, has SIL donor breast milk on hand  Pain control at delivery: Spinal  S/P PPH in PACU last night, resolved with manual clots sweep, IV TXA and IM methergine. On Methergine series PO overnight, no further bleeding.  Reports feeling better today, has been out of bed for short stint but felt dizzy. Happy to be delivered and in same room with baby in NICU. Baby stable on CPAP.  Patient reports tolerating PO.   Breast symptoms:+ colostrum Pain controlled with acetaminophen and Toradol Denies HA/SOB/C/P/N/V/dizziness. Flatus present. She reports vaginal bleeding as normal, without clots.  She is ambulating, urinating without difficulty.     Objective:   VS:    Vitals:   05/10/22 1959 05/10/22 2102 05/11/22 0100 05/11/22 0800  BP: 111/82 114/79 98/66 92/62   Pulse: 97  94 71  Resp: 16 16 17 18   Temp: 98.9 F (37.2 C) 99.4 F (37.4 C) 99.5 F (37.5 C) 98.2 F (36.8 C)  TempSrc: Oral Oral Oral Oral  SpO2: 98% 96% 97% 98%  Weight:      Height:         Intake/Output Summary (Last 24 hours) at 05/11/2022 1055 Last data filed at 05/11/2022 0530 Gross per 24 hour  Intake 2213.21 ml  Output 1697 ml  Net 516.21 ml        Recent Labs    05/10/22 1822 05/11/22 0646  WBC 7.7 9.0  HGB 9.9* 8.5*  HCT 29.3* 24.8*  PLT 126*  120* 103*     Blood type: --/--/O POS (01/16 2135)  Rubella: Immune (08/11 0000)     Physical Exam:  General: alert, cooperative, and no distress CV: Regular rate and rhythm Resp: clear Abdomen: soft, nontender, normal bowel sounds Incision: intact and  serous drainage present, small amount Uterine Fundus: firm, below umbilicus, nontender Lochia: minimal Ext: edema trace generalized  Assessment/Plan: 27 y.o.   POD# 1. [id number]   [place] 1322 QBL, stable overnight. Mild symptomatic for ABL, platelets drop to 103 this am but no sx of hemorrhage               Principal Problem:   Postpartum care following cesarean delivery 1/17 Active Problems:   Maternal anemia, with delivery - ABL  - IV Venofer today  - start oral Fe and Mag ox tomorrow.    Placenta previa antepartum in third trimester   [redacted] weeks gestation of pregnancy   Status post primary low transverse cesarean section   Doing well, stable.               Advance diet as tolerated Encourage rest when baby rests Breastfeeding support Encourage to ambulate Routine post-op care  Juliene Pina, CNM, MSN 05/11/2022, 10:55 AM

## 2022-05-11 NOTE — Progress Notes (Signed)
Patient screened out for psychosocial assessment since none of the following apply: °Psychosocial stressors documented in mother or baby's chart °Gestation less than 32 weeks °Code at delivery  °Infant with anomalies °Please contact the Clinical Social Worker if specific needs arise, by MOB's request, or if MOB scores greater than 9/yes to question 10 on Edinburgh Postpartum Depression Screen. ° °Joyice Magda Boyd-Gilyard, MSW, LCSW °Clinical Social Work °[phone] °  °

## 2022-05-11 NOTE — Lactation Note (Signed)
This note was copied from a baby's chart.  NICU Lactation Consultation Note  Patient Name: Katherine Schultz [id number] Date: 05/11/2022 Age:27 years  Subjective Reason for consult: Initial assessment; NICU baby; Preterm <34wks; Infant < 6lbs  Visited with family of 38 hours old pre-term NICU female; [name] is a P2 and experienced breastfeeding. She's already pumping and getting small amounts of colostrum, praised her for her efforts. Baby just started tube feedings, let parents know that we'll start scoring for IDF at 34 weeks. Provided # 21 flanges per patient's request; she's been using the # 24 but voiced they are drawing too much areolar tissue. She has # 19 at home for her Spectra; Medela flanges # 21 seem an appropriate fit at this time. Reviewed pumping schedule, lactogenesis II, benefits of STS care and anticipatory guidelines.  Objective Infant data: Mother's Current Feeding Choice: Breast Milk  Maternal data: [id number]  C-Section, Low Transverse Significant Breast History:: (+) breast changes during the pregnancy Current breast feeding challenges:: NICU admission Does the patient have breastfeeding experience prior to this delivery?: Yes How long did the patient breastfeed?: 22 months Pumping frequency: q 3 hours (recommneded) Pumped volume: 0 mL (drops) Flange Size: 24; 21 Risk factor for low milk supply:: prematurity, infant separation 1222 cc. blood loss Pump: Personal (Spectra S1)  Assessment Infant: Feeding Status: -- (Scheduled feedings)  Maternal: Milk volume: Normal  Intervention/Plan Interventions: Breast feeding basics reviewed; DEBP; Education; Infant Driven Feeding Algorithm education Tools: Pump; Flanges; Coconut oil Pump Education: Setup, frequency, and cleaning; Milk Storage  Plan of care: Encouraged bilateral pumping every 3 hours, ideally 8 pumping sessions/24 hours Breast massage, hand expression and coconut oil/nipple butter were also  encouraged prior pumping Parents will try engaging in as much STS care as they can, especially during tube feedings  FOB present and supportive. All questions and concerns answered, family to contact [facility] services PRN.  Consult Status: NICU follow-up  NICU Follow-up type: New admission follow up; Maternal D/C visit   Katherine Schultz Katherine Schultz 05/11/2022, 4:50 PM

## 2022-05-12 LAB — CBC
HCT: 22.4 % — ABNORMAL LOW (ref 36.0–46.0)
Hemoglobin: 7.1 g/dL — ABNORMAL LOW (ref 12.0–15.0)
MCH: 30.6 pg (ref 26.0–34.0)
MCHC: 31.7 g/dL (ref 30.0–36.0)
MCV: 96.6 fL (ref 80.0–100.0)
Platelets: 106 10*3/uL — ABNORMAL LOW (ref 150–400)
RBC: 2.32 MIL/uL — ABNORMAL LOW (ref 3.87–5.11)
RDW: 14.5 % (ref 11.5–15.5)
WBC: 7.7 10*3/uL (ref 4.0–10.5)
nRBC: 0 % (ref 0.0–0.2)

## 2022-05-12 LAB — SURGICAL PATHOLOGY

## 2022-05-12 MED ORDER — ONDANSETRON 4 MG PO TBDP
4.0000 mg | ORAL_TABLET | Freq: Four times a day (QID) | ORAL | Status: DC | PRN
  Administered 2022-05-12 – 2022-05-13 (×2): 4 mg via ORAL
  Filled 2022-05-12 (×2): qty 1

## 2022-05-12 NOTE — Progress Notes (Signed)
      Subjective: POD# 2, S/P 1LTCS at 33.4 wks for bleeding previa, PPH QBL 1322cc Live born female - NICU, C-PAP Birth Weight: 4 lb 11.1 oz (2130 g) APGAR: 8, 9  Feeding: planned breast, mom pumping, has SIL donor breast milk on hand  Vaginal bleeding is minimal. Ambulating without SOB/ CP/ dizziness. Says vision is blurred and fingers feel stiff.  Eating well. Voiding, Flatus +. Pain controlled.  Drinking Mother's milk tea for lactation. Wink assisting.   Objective: BP 94/64   Pulse 93   Temp 97.8 F (36.6 C) (Oral)   Resp 16   Ht 5\' 6"  (1.676 m)   Wt 84.3 kg   SpO2 98%   Breastfeeding Unknown   BMI 29.99 kg/m   Patient Vitals for the past 24 hrs:  BP Temp Temp src Pulse Resp SpO2  05/12/22 0642 94/64 97.8 F (36.6 C) Oral 93 16 98 %  05/11/22 2023 102/65 97.8 F (36.6 C) Oral 82 17 98 %  05/11/22 1547 98/66 98.4 F (36.9 C) Oral 76 18 --  05/11/22 1252 -- 98.6 F (37 C) Oral -- 18 98 %          Latest Ref Rng & Units 05/12/2022    7:05 AM 05/11/2022    6:46 AM 05/10/2022    6:22 PM  CBC  WBC 4.0 - 10.5 K/uL 7.7  9.0  7.7   Hemoglobin 12.0 - 15.0 g/dL 7.1  8.5  9.9   Hematocrit 36.0 - 46.0 % 22.4  24.8  29.3   Platelets 150 - 400 K/uL 106  103  120    126      Blood type: --/--/O POS (01/16 2135) Rubella: Immune (08/11 0000)    Physical Exam:  General: alert, cooperative, and no distress CV: Regular rate and rhythm Resp: clear Abdomen: soft, nontender, normal bowel sounds Incision: intact and serous drainage present, small amount Uterine Fundus: firm, below umbilicus, nontender Lochia: minimal Ext: edema trace  Assessment/Plan: 27 y.o.   POD# 2. [id number]  HD #16, Bleeding Previa admission, acute hemorrhage w/ Emerg 1' LTCS on 1/17 with [id number] cc. ABL anemia- Asymptomatic. S/p IV Iron on 1/18. Symptoms reviewed, is well adjusted euvolemic, prefers to avoid blood transfusion                Routine PP care dw pt  Per NICU NP, baby dropped Hematocrit  10 points and pt has declined vitamin K due to "worried about black box warning".  I counseled her thoroughly on risks of not giving baby Vitamin K especially premature babies and concern and further risk of hemorrhage.   Pharmacist came down to discuss details. I offered her if she wanted to talk to Estill Bamberg, her CNM, she declined, she was happy with the talk we had and will rethink, encouraged to understands and d/w husband   Katherine Schultz 05/12/2022, 11:25 AM

## 2022-05-12 NOTE — Lactation Note (Signed)
This note was copied from a baby's chart.  NICU Lactation Consultation Note  Patient Name: Katherine Schultz [id number] Date: 05/12/2022 Age:29 hours   Subjective Reason for consult: Follow-up assessment; NICU baby; Preterm <34wks; Infant < 6lbs  Lactation followed up with Katherine Schultz. She endorses consistent pumping and hand expression. Her milk volume is appropriate for 41 hours. I reviewed pumping basics and day 2 education.  Objective Infant data: Mother's Current Feeding Choice: Breast Milk and Donor Milk  Maternal data: [id number]  C-Section, Low Transverse Significant Breast History:: (+) breast changes during the pregnancy  Current breast feeding challenges:: NICU; preterm  Does the patient have breastfeeding experience prior to this delivery?: Yes How long did the patient breastfeed?: 22 months  Pumping frequency: q3 hours Pumped volume: 5 mL (varies from one session to the next) Flange Size: 24; 21  Risk factor for low milk supply:: prematurity, infant separation 1222 cc. blood loss  Pump: Personal  Assessment Infant: Feeding Status: -- (Scheduled feedings)  Maternal: Milk volume: Normal  Intervention/Plan Interventions: Breast feeding basics reviewed; Education  Tools: Pump; Flanges; Coconut oil Pump Education: Setup, frequency, and cleaning  Plan: Consult Status: NICU follow-up  NICU Follow-up type: Weekly NICU follow up; Verify absence of engorgement; Verify onset of copious milk    Lenore Manner 05/12/2022, 10:47 AM Lenore Manner 05/12/2022, 10:47 AM

## 2022-05-13 LAB — CBC
HCT: 24.9 % — ABNORMAL LOW (ref 36.0–46.0)
Hemoglobin: 8 g/dL — ABNORMAL LOW (ref 12.0–15.0)
MCH: 30.9 pg (ref 26.0–34.0)
MCHC: 32.1 g/dL (ref 30.0–36.0)
MCV: 96.1 fL (ref 80.0–100.0)
Platelets: 136 10*3/uL — ABNORMAL LOW (ref 150–400)
RBC: 2.59 MIL/uL — ABNORMAL LOW (ref 3.87–5.11)
RDW: 14.7 % (ref 11.5–15.5)
WBC: 8 10*3/uL (ref 4.0–10.5)
nRBC: 0.2 % (ref 0.0–0.2)

## 2022-05-13 MED ORDER — ONDANSETRON 4 MG PO TBDP: 4.0000 mg | ORAL_TABLET | Freq: Four times a day (QID) | ORAL | 0 refills | Status: AC | PRN

## 2022-05-13 MED ORDER — IBUPROFEN 600 MG PO TABS: 600.0000 mg | ORAL_TABLET | Freq: Four times a day (QID) | ORAL | 0 refills | Status: AC

## 2022-05-13 MED ORDER — OXYCODONE HCL 5 MG PO TABS: 5.0000 mg | ORAL_TABLET | ORAL | 0 refills | Status: AC | PRN

## 2022-05-13 NOTE — Progress Notes (Addendum)
No c/o;  pain controlled, some nausea last night otherwise tolerating po - thinks was d/t narcotic +flatus; voiding w/o difficulty; ambulating Breastfeeding  Patient Vitals for the past 24 hrs:  BP Temp Temp src Pulse Resp SpO2  05/13/22 0929 111/88 98.7 F (37.1 C) Oral 94 18 --  05/13/22 0531 106/72 98 F (36.7 C) Oral 92 16 98 %  05/12/22 2048 106/68 98 F (36.7 C) Oral 94 17 98 %  05/12/22 1338 102/68 98.1 F (36.7 C) Oral 84 16 --   A&ox3 Rrr Ctab Abd: soft,nt,nd; fundus firm and below umb; dressing c/d/I LE: no edema, nt bilat     Latest Ref Rng & Units 05/13/2022    5:24 AM 05/12/2022    7:05 AM 05/11/2022    6:46 AM  CBC  WBC 4.0 - 10.5 K/uL 8.0  7.7  9.0   Hemoglobin 12.0 - 15.0 g/dL 8.0  7.1  8.5   Hematocrit 36.0 - 46.0 % 24.9  22.4  24.8   Platelets 150 - 400 K/uL 136  106  103    A/P: pod 3 s/p emergent 1ltcs for placenta previa, PPH Doing well - d/c and will room in; f/u office 6 wks Acute anemia- stable, abl - plan iron q day with iron rich foods; s/p iv iron 1/18; f/u 2 wk RH POS RI Baby girl, NICU Thrombocytopenia - improving and plan f/u pp

## 2022-05-13 NOTE — Discharge Summary (Signed)
Postpartum Discharge Summary  Date of Service updated     Patient Name: Jrue Yambao DOB: 21-Feb-1996 MRN: [id number]  Date of admission: 04/27/2022 Delivery date:05/10/2022  Delivering provider: Langley Gauss A  Date of discharge: 05/13/2022  Admitting diagnosis: Placenta previa antepartum in third trimester [O44.03] Intrauterine pregnancy: [redacted]w[redacted]d     Secondary diagnosis:  Principal Problem:   Postpartum care following cesarean delivery 1/17 Active Problems:   Maternal anemia, with delivery - ABL   Placenta previa antepartum in third trimester   [redacted] weeks gestation of pregnancy   Status post primary low transverse cesarean section  Additional problems: thrombocytotenia    Discharge diagnosis: Preterm Pregnancy Delivered, Anemia, PPH, and placenta previa                                                Hospital course: The patient was admitted at 31.5 wga for bleeding with known placenta previa. She was kept until delivery d/t prior bleedindg episodes. Antepartum testing was reasuring during admission, rescue steroids given, and patient was stable. HD 14 the patient had another episode of bleeding and then underwent a 1ltcs complicated with PPH.  She has met post op milestones and did receive IV iron for anemia for which she is asymptomatic.  She is being discharged and will room in with baby in NICU.  See patient progress note also. She is stable for d/c.   Physical exam  Vitals:   05/12/22 1338 05/12/22 2048 05/13/22 0531 05/13/22 0929  BP: 102/68 106/68 106/72 111/88  Pulse: 84 94 92 94  Resp: 16 17 16 18   Temp: 98.1 F (36.7 C) 98 F (36.7 C) 98 F (36.7 C) 98.7 F (37.1 C)  TempSrc: Oral Oral Oral Oral  SpO2:  98% 98%   Weight:      Height:       General: alert, cooperative, and no distress Lochia: appropriate Uterine Fundus: firm Incision: Healing well with no significant drainage DVT Evaluation: No evidence of DVT seen on physical exam. Labs: Lab Results   Component Value Date   WBC 8.0 05/13/2022   HGB 8.0 (L) 05/13/2022   HCT 24.9 (L) 05/13/2022   MCV 96.1 05/13/2022   PLT 136 (L) 05/13/2022      Latest Ref Rng & Units 03/28/2022    7:55 AM  CMP  Glucose 70 - 99 mg/dL 110    Edinburgh Score:    05/11/2022    3:47 PM  Edinburgh Postnatal Depression Scale Screening Tool  I have been able to laugh and see the funny side of things. 0  I have looked forward with enjoyment to things. 0  I have blamed myself unnecessarily when things went wrong. 0  I have been anxious or worried for no good reason. 0  I have felt scared or panicky for no good reason. 0  Things have been getting on top of me. 0  I have been so unhappy that I have had difficulty sleeping. 0  I have felt sad or miserable. 0  I have been so unhappy that I have been crying. 0  The thought of harming myself has occurred to me. 0  Edinburgh Postnatal Depression Scale Total 0      After visit meds:  Allergies as of 05/13/2022   No Known Allergies      Medication List  STOP taking these medications    NIFEdipine 10 MG capsule Commonly known as: PROCARDIA       TAKE these medications    docusate sodium 100 MG capsule Commonly known as: COLACE Take 1 capsule (100 mg total) by mouth 2 (two) times daily.   famotidine 20 MG tablet Commonly known as: PEPCID Take 1 tablet (20 mg total) by mouth 2 (two) times daily.   ibuprofen 600 MG tablet Commonly known as: ADVIL Take 1 tablet (600 mg total) by mouth every 6 (six) hours.   lactobacillus acidophilus Tabs tablet Take 2 tablets by mouth 3 (three) times daily.   magnesium 84 MG (7MEQ) Tbcr SR tablet Commonly known as: MAGTAB Take 84 mg by mouth.   ondansetron 4 MG disintegrating tablet Commonly known as: ZOFRAN-ODT Take 1 tablet (4 mg total) by mouth every 6 (six) hours as needed for nausea.   oxyCODONE 5 MG immediate release tablet Commonly known as: Oxy IR/ROXICODONE Take 1-2 tablets (5-10 mg  total) by mouth every 4 (four) hours as needed for moderate pain.   Prenatal 28-0.8 MG Tabs Take by mouth daily.         Discharge home in stable condition Infant Disposition:NICU Discharge instruction: per After Visit Summary and Postpartum booklet. Activity: Advance as tolerated. Pelvic rest for 6 weeks.  Diet: routine diet Postpartum Appointment:6 weeks Additional Postpartum F/U:  2 wks to f/u anemia Future Appointments:No future appointments. Follow up Visit:      05/13/2022 Charyl Bigger, MD

## 2022-05-16 ENCOUNTER — Ambulatory Visit: Payer: Self-pay

## 2022-05-16 NOTE — Lactation Note (Signed)
This note was copied from a baby's chart. Lactation Consultation Note  Patient Name: Katherine Schultz [id number] Date: 05/16/2022   Age:29 days  LC spoke with FOB as Mrs. Mcavoy went home to spend a night at home.  FOB will be spending the night with the baby.  FOB denies Mom is having any engorgement as she is pumping consistently and getting "good" volumes.  Mom has a good DEBP at home, FOB thinks its a Spectra 2.  LC noted pump parts by the sink.  Provided washing and drying bins and demonstrated disassembling pump parts, washing, rinsing and air drying in separate bin.    FOB states Mom will be back tomorrow am.  Asked FOB to relay to Mom to ask for lactation help prn.      Broadus John 05/16/2022, 4:28 PM

## 2022-05-18 ENCOUNTER — Telehealth (HOSPITAL_COMMUNITY): Payer: Self-pay | Admitting: *Deleted

## 2022-05-18 NOTE — Telephone Encounter (Signed)
Attempted Hospital Discharge Follow-Up Call.  No answer on patient phone.  Unable to leave a message.  

## 2022-05-21 ENCOUNTER — Ambulatory Visit: Payer: Self-pay

## 2022-05-21 NOTE — Lactation Note (Signed)
This note was copied from a baby's chart.  NICU Lactation Consultation Note  Patient Name: Katherine Schultz [id number] Date: 05/21/2022 Age:29 days  Subjective Reason for consult: Follow-up assessment; NICU baby; Infant < 6lbs; Late-preterm 69-36.6wks  Visited with family of 68 52/76 weeks old (adjusted) NICU female; Ms. Medine is a P2 and experienced breastfeeding. She reports pumping is going well and her volumes continue to slowly increase, praised her for her efforts. She understands that her volumes are borderline low due to risk factors but she's willing to continue working to keep up with her supply. She has also been taking baby "Rod Holler" to breast for some "lick & learn" one of her sessions was 45 minutes. Reviewed pumping schedule, lactogenesis III, strategies to increase supply, benefits of STS care and IDF1/2.  Objective Infant data: Mother's Current Feeding Choice: Breast Milk and Donor Milk  Infant feeding assessment Scale for Readiness: 4  Maternal data: G2P1102  C-Section, Low Transverse Current breast feeding challenges:: NICU admission Pumping frequency: 6 times/24 hours Pumped volume: 40 mL (40-60 ml.) Flange Size: 21 Pump: Personal (Spectra S1)  Assessment Infant: Feeding Status: -- (Scheduled feedings)  Maternal: Milk volume: Low No S/S of engorgement at this time  Intervention/Plan Interventions: Breast feeding basics reviewed; DEBP; Education; Infant Driven Feeding Algorithm education Tools: Pump; Flanges Pump Education: Setup, frequency, and cleaning; Milk Storage  Plan of care: Encouraged bilateral pumping every 3 hours, ideally 8 pumping sessions/24 hours She'll start power pumping in the AM Parents will try engaging in as much STS care as they can, especially during tube feedings She'll continue taking baby to breast and will limit feedings/attempts to no more than 30 minutes at a time.   No other support person at this time. All questions and  concerns answered, family to contact [facility] services PRN.   Consult Status: NICU follow-up  NICU Follow-up type: Weekly NICU follow up; Assist with IDF-1 (Mother to pre-pump before breastfeeding)   Aubriegh Minch Francene Boyers 05/21/2022, 12:27 PM

## 2022-05-25 ENCOUNTER — Ambulatory Visit: Payer: Self-pay

## 2022-05-25 NOTE — Lactation Note (Signed)
This note was copied from a baby's chart.  NICU Lactation Consultation Note  Patient Name: Girl Georgina Krist OMVEH'M Date: 05/25/2022 Age:29 wk.o.   Subjective Reason for consult: Follow-up assessment; Late-preterm 34-36.6wks; Infant < 6lbs; Breastfeeding assistance; NICU baby  LC in to visit with P2 Mom of "[name]" in the NICU.  Baby has been exclusively gavage fed and has been showing increased feeding readiness.    After SLP consult yesterday, Mom was told she didn't need to pre-pump.    [name] placed STS in cross cradle hold due to already having feeding cues.  Mom assisted to support her breast in a U hold and baby latched on with ease.  Baby not consistently sucking, many pauses of a minute or two, but when she started sucking, noted deep jaw extensions and swallows.  Baby fatigued during the feeding, did not come off the breast and started back with nutritive sucking.  No signs of stress during feeding. Baby noted to be nutritive for 5 mins total.  After 15 min of being on the breast, baby stopped sucking and taken off and placed prone STS on Mom's chest.  Baby appeared contented.  Mom to pump once baby's gavage feeding is complete.   Encouraged offering the breast with feeding cues.  Mom will ask for help prn.  Objective Infant data: Infant feeding assessment Scale for Readiness: 3  Maternal data: G2P1102  C-Section, Low Transverse Pumping frequency: consistent Pumped volume: 120 mL Flange Size: 21 Pump: Personal (Spectra S1)  Assessment Infant: LATCH Score: 7  Maternal: Milk volume: Normal Intervention/Plan Interventions: Breast feeding basics reviewed; Assisted with latch; Skin to skin; Hand express; Breast compression; Adjust position; Support pillows; Position options; DEBP; Infant Driven Feeding Algorithm education  Tools: Pump; Flanges  Plan: Consult Status: NICU follow-up  NICU Follow-up type: Assist with IDF-2 (Mother does not need to pre-pump before  breastfeeding)  Tilda Burrow RN IBCLC

## 2022-05-28 ENCOUNTER — Ambulatory Visit: Payer: Self-pay

## 2022-05-28 NOTE — Lactation Note (Addendum)
This note was copied from a baby's chart. Lactation Consultation Note  Patient Name: Katherine Schultz [id number] Date: 05/28/2022 Reason for consult: Follow-up assessment;NICU baby;Late-preterm 34-36.6wks;Breastfeeding assistance;RN request;Infant < 6lbs Age:29 wk.o.  Visited with family of 61 66/37 weeks old (adjusted) NICU female, Katherine Schultz is a P2 and experienced breastfeeding. NICU RN Lattie Haw requested a feeding assist for the 12 pm feeding to assess if baby is ready to start IDF. She has been going to breast daily while in the NICU (see flowsheets). This LC took baby "Katherine Schultz" to the R breast STS in cross cradle hold and she was able to latch right away although required some stimulation to continue sucking (see LATCH score). She did well by pacing when getting maternal letdowns, she nurse for a total of 21 minutes, 7 were NS; used a stopwatch. Notified NICU RN to do 2/3 of the gavage feeding per IDF protocol. Katherine Schultz voiced that pumping is also going well and that her supply has increased since she started power pumping and taking baby to breast; praised her for her efforts.  Feeding Mother's Current Feeding Choice: Breast Milk and Donor Milk  LATCH Score Latch: Repeated attempts needed to sustain latch, nipple held in mouth throughout feeding, stimulation needed to elicit sucking reflex. (at bare breast, required some stimulation once she stopped sucking, she started getting sleepy after 10 minutes)  Audible Swallowing: A few with stimulation  Type of Nipple: Everted at rest and after stimulation  Comfort (Breast/Nipple): Soft / non-tender  Hold (Positioning): No assistance needed to correctly position infant at breast.  LATCH Score: 8  Lactation Tools Discussed/Used Tools: Pump;Flanges Flange Size: 21 Breast pump type: Double-Electric Breast Pump Pump Education: Setup, frequency, and cleaning;Milk Storage Reason for Pumping: LPI in NICU Pumping frequency: 7 times/24 hours Pumped  volume: 120 mL  Interventions Interventions: Breast feeding basics reviewed;Assisted with latch;Breast compression;Adjust position;Support pillows;DEBP;Education;Skin to skin  Plan of care: Encouraged to continue bilateral pumping every 3 hours, 7-8 pumping sessions/24 hours She'll continue power pumping in the AM She'll start taking baby to a full breast and will limit feedings/attempts to no more than 30 minutes at a time. Will let RN know if Katherine Schultz has done more than 5 minutes of NS in order to apply IDF algorithm    No other support person at this time. All questions and concerns answered, family to contact [facility].  Consult Status Consult Status: NICU follow-up Date: 05/28/22 Follow-up type: In-patient   Katherine Schultz Francene Boyers 05/28/2022, 12:55 PM

## 2022-06-05 ENCOUNTER — Ambulatory Visit: Payer: Self-pay

## 2022-06-05 NOTE — Lactation Note (Addendum)
This note was copied from a baby's chart.  NICU Lactation Consultation Note  Patient Name: Girl Aljean Pickering [id number] Date: 06/05/2022 Age:29 wk.o.   Subjective Reason for consult: Follow-up assessment; NICU baby; Early term 89-38.6wks; Infant < 6lbs; Breastfeeding assistance  LC in to assist/assess baby at the breast.  LC suggested Mom pre-pump 5 min to assure baby is able to get to hind milk .  Baby just woke and is actively cueing, so Mom can try it at another feeding.  Undressed baby for STS which is recommended to keep baby more stimulated during the feeding.  Mom using cross cradle hold with good pillow support.  Baby opened her mouth widely and attained a deep latch.  Baby able to sustain with pauses in sucking for 29 mins.  Baby showed no signs of stress during the feeding.  At times, baby noted to be slipping from a deep latch, and LC showed how to tug on baby's chin and open her mouth with flanged lips.  Baby very consistent on the breast, at times a little sleepy.  LC suggested Mom offer alternate breast compression during sucking and once she did that, baby's suck became deep and slow.  Swallows identified regularly.  Encouraged continued pumping after breast feeding.  Objective Infant data:  Infant feeding assessment Scale for Readiness: 1 Scale for Quality: 2     Maternal data: G2P1102  C-Section, Low Transverse Pumping frequency: 7-8 times/24 hrs Pumped volume: 120 mL Flange Size: 21  Pump: Personal (Spectra S1)  Assessment Infant: LATCH Score: 9  Feeding Status: Ad lib   Maternal:  Intervention/Plan Interventions: Breast feeding basics reviewed; Assisted with latch; Skin to skin; Breast massage; Hand express; Pre-pump if needed; Breast compression; Adjust position; Support pillows; Position options; DEBP  Tools: Pump; Flanges  Plan: Consult Status: NICU follow-up  NICU Follow-up type: Assist with IDF-2 (Mother does not need to pre-pump before  breastfeeding)    Broadus John 06/05/2022, 2:20 PM

## 2022-06-05 NOTE — Lactation Note (Signed)
This note was copied from a baby's chart. Lactation Consultation Note  Patient Name: Katherine Schultz [id number] Date: 06/05/2022   Age:29 wk.o.  [facility] visited with P2 Mom of baby.   Mom has been here with baby exclusively breastfeeding "[name]" over the weekend.  Baby has gained 5-10 gms each day without the NG tube supplement.  Baby did receive 2 breastmilk bottles each day.  Mom is anxious about baby gaining so she can be discharged home. Mom describes a vigorous 5 minute breastfeed with "gulping" initially.  Then baby tires and usually doesn't feed for more than 5 more minutes.    Mom has a good milk supply and has been consistently pumping and power-pumping once a day.  LC suggested at the next feeding, to pre-pump for 5 mins (2 mins of stim and 3 mins of expression setting on her Spectra).  If her foremilk is expressed before baby latches, she may be able to consume the hind milk which is higher in fat and calories.  Mom to call for feeding assess/assist at next feeding.   Broadus John 06/05/2022, 11:49 AM

## 2022-07-04 IMAGING — CR DG CHEST 2V
2 series · 2 of 2 positions shown · non-contrast
Comparison: None.

CLINICAL DATA: Dizziness, lightheaded, wheezing and tachycardia

EXAM:
CHEST - 2 VIEW

[chest ap]
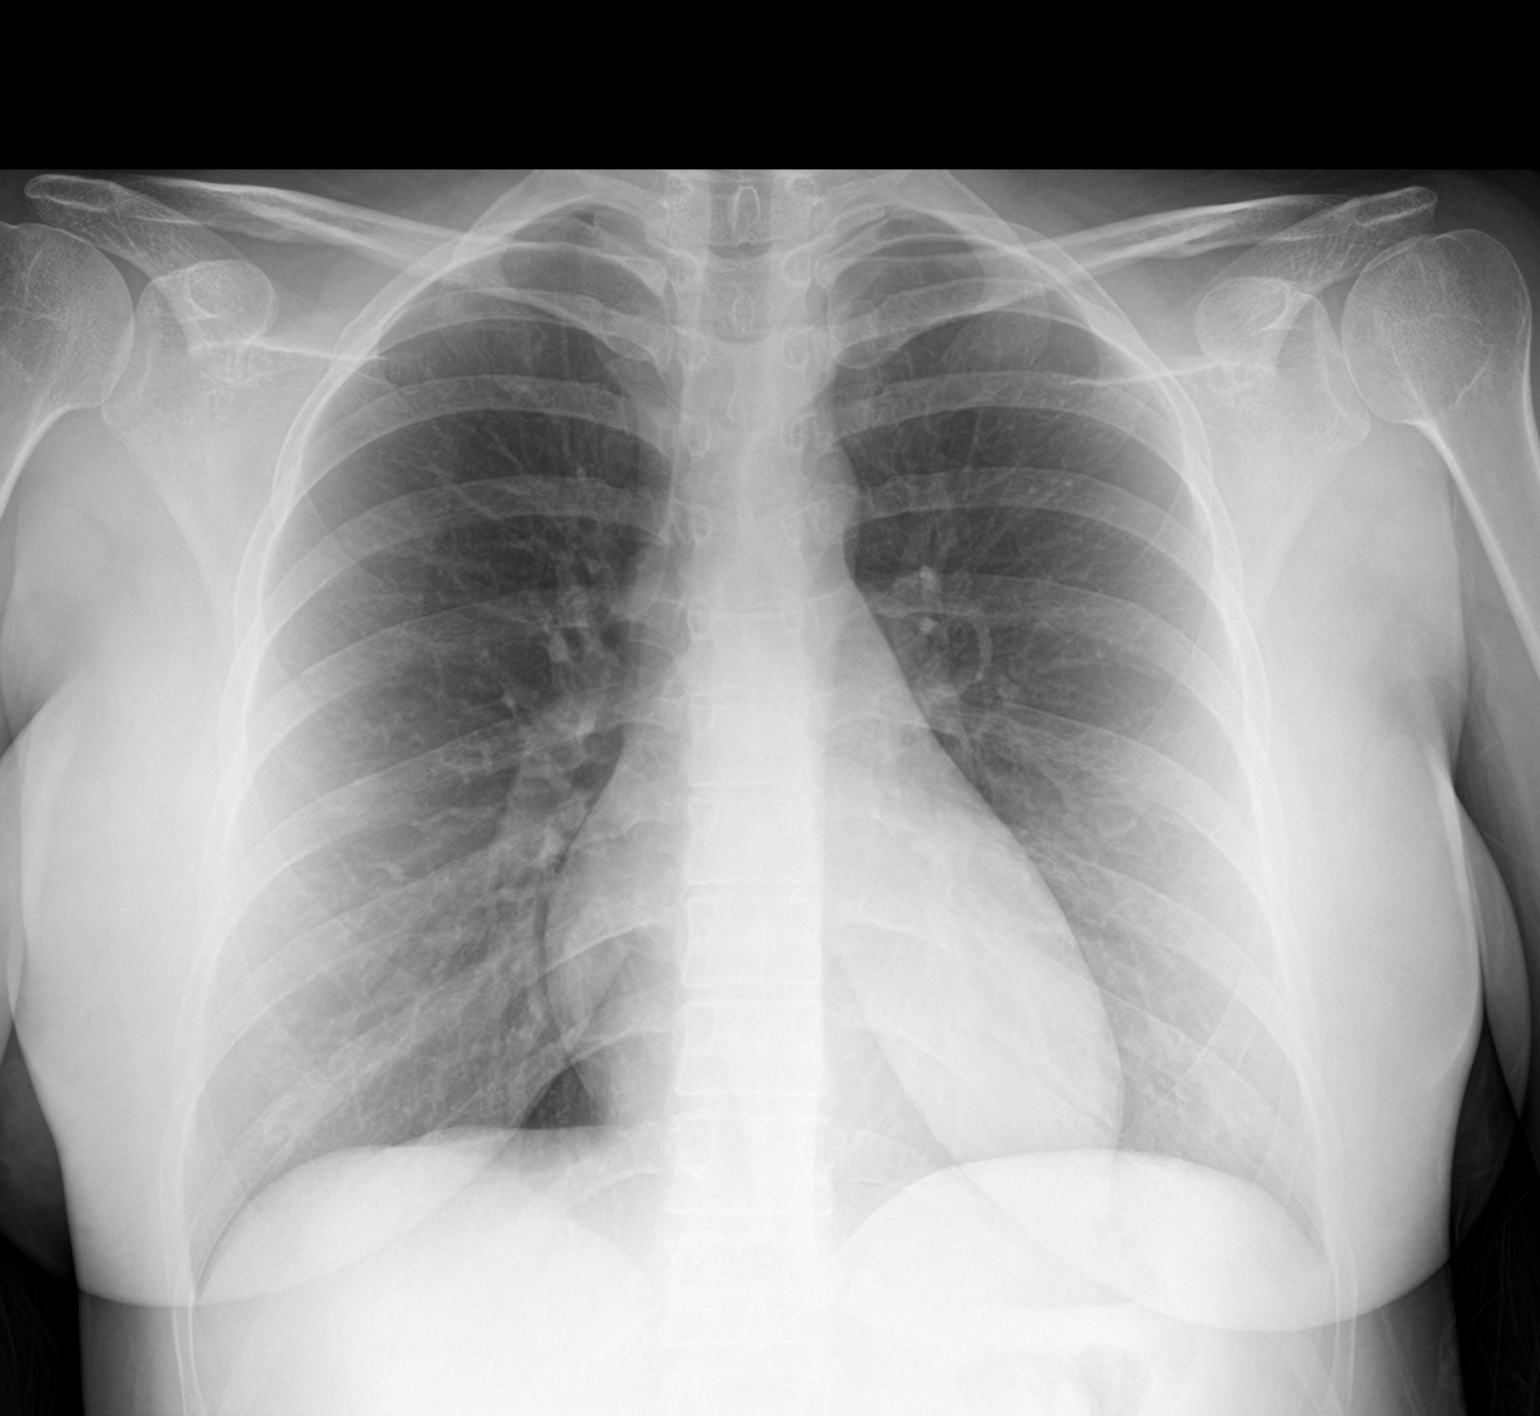

[chest lat]
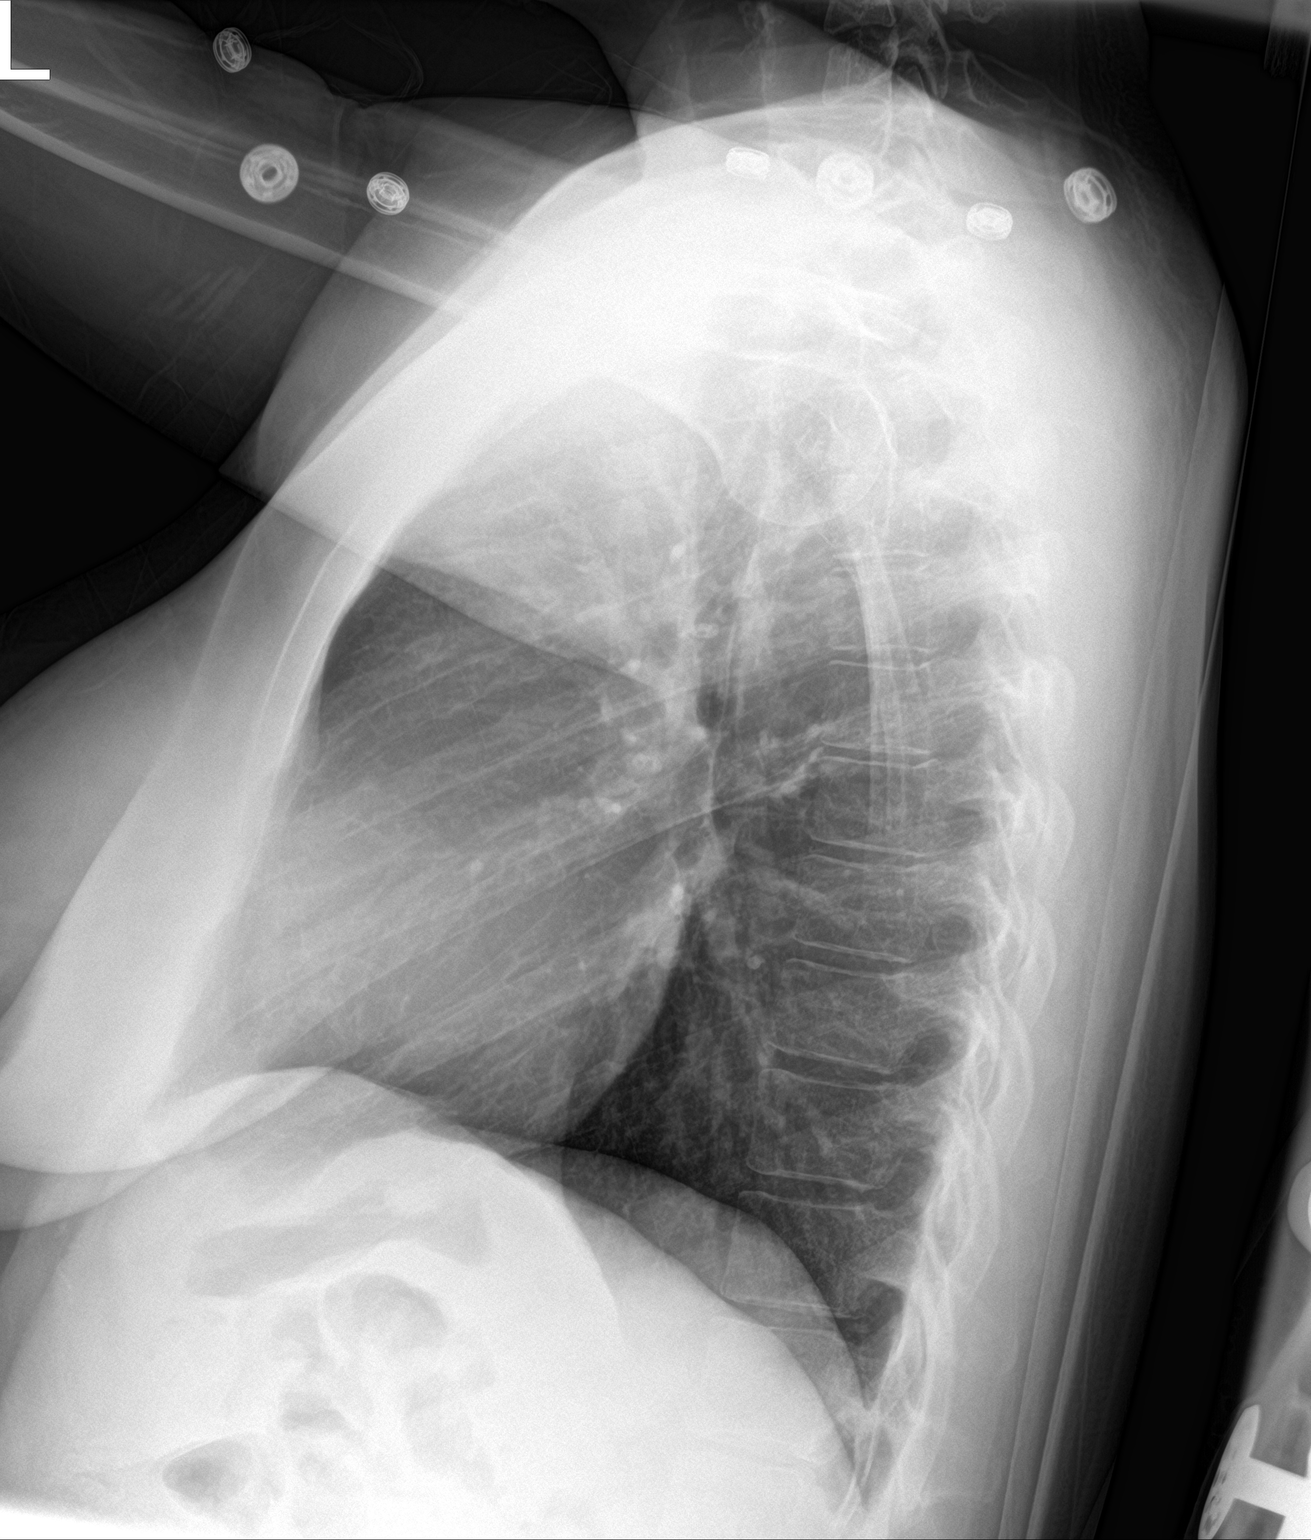

[2 of 2 positions shown; findings below may reference images not displayed]

FINDINGS: Mild airways thickening. No consolidation, features of edema,
pneumothorax, or effusion. Pulmonary vascularity is normally
distributed. The cardiomediastinal contours are unremarkable. No
acute osseous or soft tissue abnormality.
IMPRESSION: Mild airways thickening compatible with bronchitis or reactive
airways disease.

## 2022-08-28 ENCOUNTER — Encounter: Payer: Self-pay | Admitting: Emergency Medicine

## 2022-08-28 DIAGNOSIS — R221 Localized swelling, mass and lump, neck: Secondary | ICD-10-CM

## 2022-09-20 ENCOUNTER — Other Ambulatory Visit: Payer: Self-pay | Admitting: Emergency Medicine

## 2022-09-20 DIAGNOSIS — R221 Localized swelling, mass and lump, neck: Secondary | ICD-10-CM

## 2022-10-03 ENCOUNTER — Other Ambulatory Visit: Payer: Managed Care, Other (non HMO)

## 2022-10-11 ENCOUNTER — Ambulatory Visit
Admission: RE | Admit: 2022-10-11 | Discharge: 2022-10-11 | Disposition: A | Discharge status: Home / Self Care | Payer: Managed Care, Other (non HMO) | Source: Ambulatory Visit | Attending: Emergency Medicine | Admitting: Emergency Medicine

## 2022-10-11 DIAGNOSIS — R221 Localized swelling, mass and lump, neck: Secondary | ICD-10-CM

## 2022-12-06 IMAGING — MR MR HEAD W/O CM
11 series · 45 of 48 positions shown · non-contrast
Comparison: None available.

CLINICAL DATA: Initial evaluation for 2 month history of dizziness,
head pressure.

EXAM:
MRI HEAD WITHOUT CONTRAST
TECHNIQUE: Multiplanar, multiecho pulse sequences of the brain and surrounding
structures were obtained without intravenous contrast.

[Series 5: ax dwi_tracew · axial · 3.0mm · 0.65mm/px · z∈[-86,+65]mm · 5 of 48 slices shown]
[im 1/48]
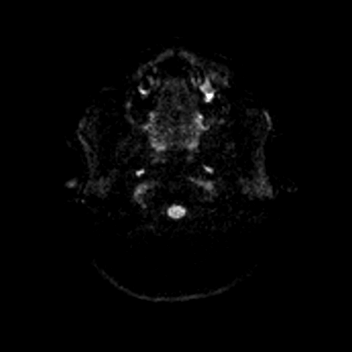
[im 12/48]
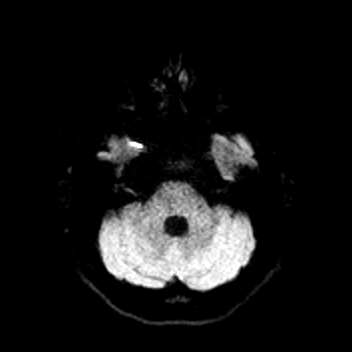
[im 24/48]
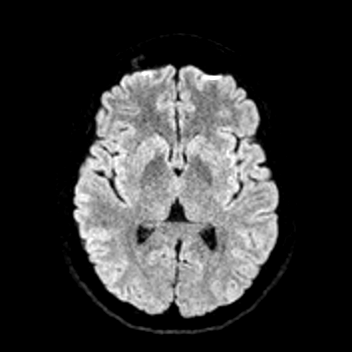
[im 36/48]
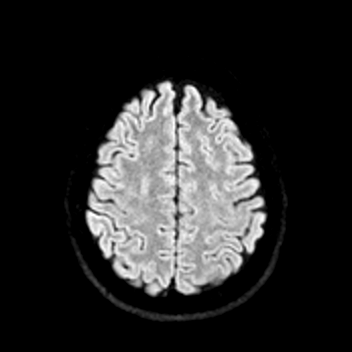
[im 48/48]
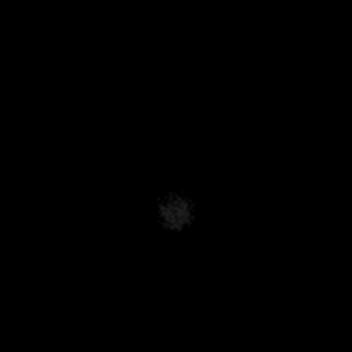

[Series 6: ax dwi_adc · axial · 3.0mm · 0.65mm/px · z∈[-86,+62]mm · 4 of 47 slices shown]
[im 1/47]
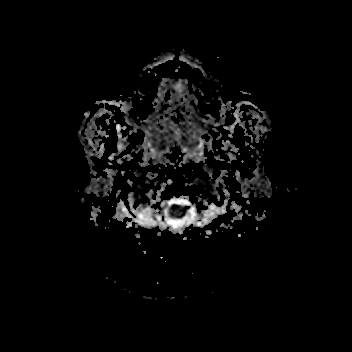
[im 16/47]
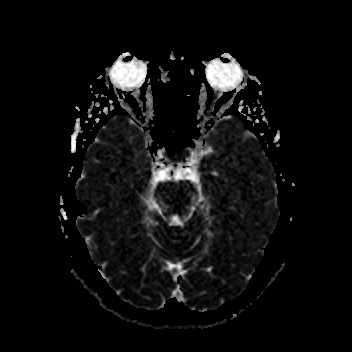
[im 31/47]
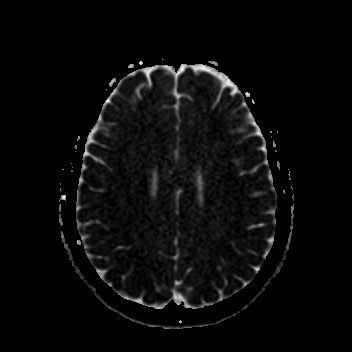
[im 47/47]
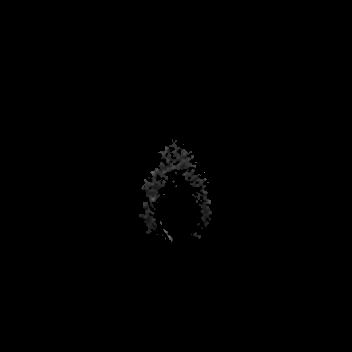

[Series 7: cor dwi_tracew · coronal · 5.0mm · 0.65mm/px · 3 of 38 slices shown]
[im 1/38]
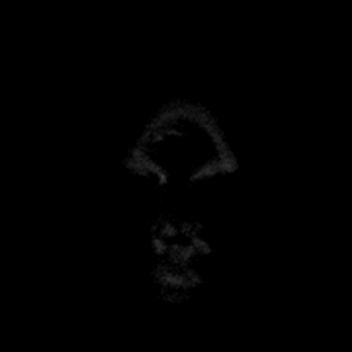
[im 19/38]
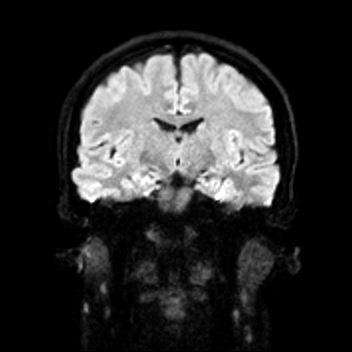
[im 38/38]
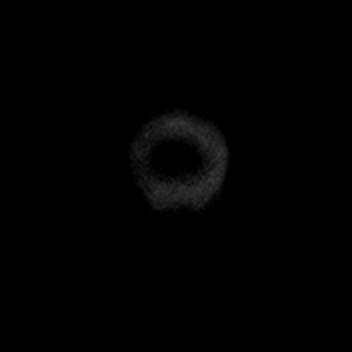

[Series 8: cor dwi_adc · coronal · 5.0mm · 0.65mm/px · 3 of 38 slices shown]
[im 1/38]
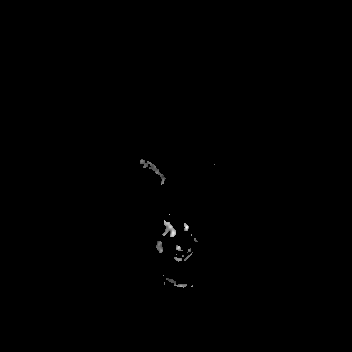
[im 19/38]
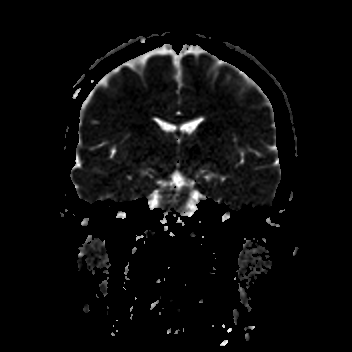
[im 38/38]
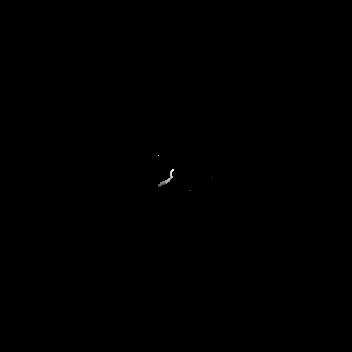

[Series 9: T1 · sagittal · 5.0mm · 0.62mm/px · 2 of 25 slices shown (1 of 2)]
[im 1/25]
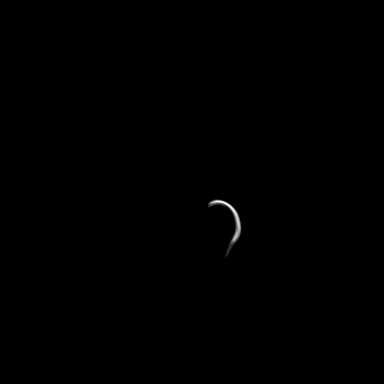
[im 25/25]
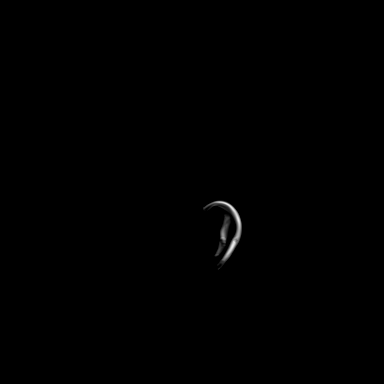

[Series 10: T2 · axial · 5.0mm · 0.53mm/px · z∈[-85,+66]mm · 2 of 27 slices shown (1 of 2)]
[im 1/27]
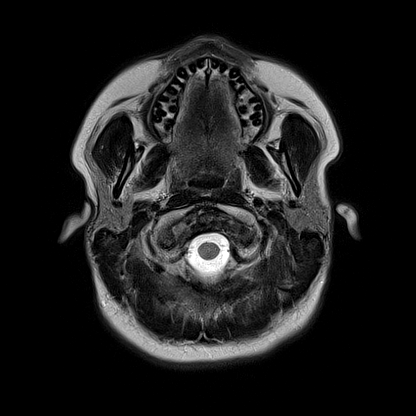
[im 27/27]
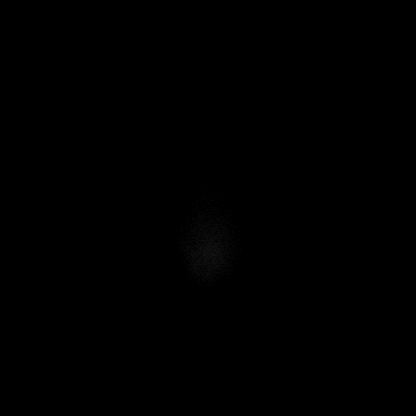

[Series 12: pha_images · axial · 3.0mm · 0.90mm/px · z∈[-97,+70]mm · 4 of 56 slices shown]
[im 1/56]
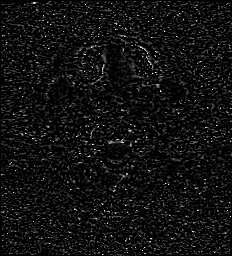
[im 19/56]
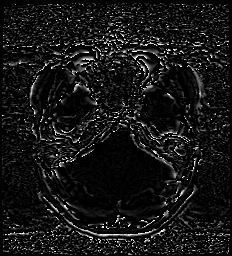
[im 37/56]
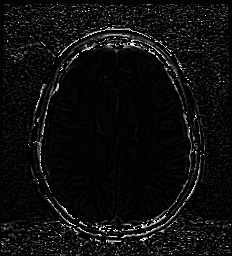
[im 56/56]
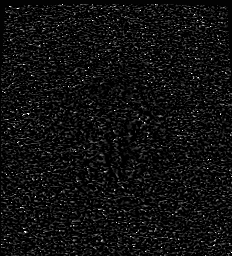

[Series 13: swi_images · axial · 3.0mm · 0.90mm/px · z∈[-97,+75]mm · 5 of 60 slices shown]
[im 1/60]
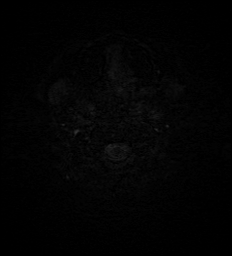
[im 15/60]
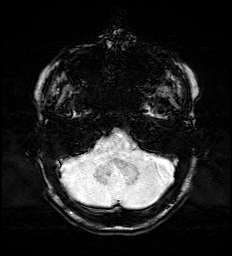
[im 30/60]
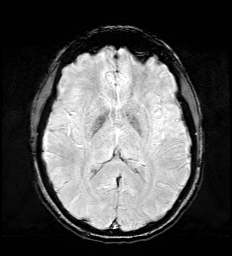
[im 45/60]
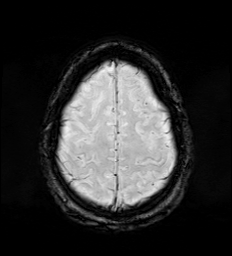
[im 60/60]
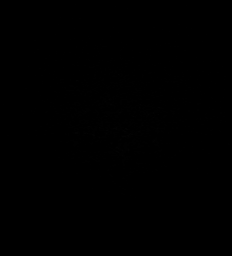

[Series 15: FLAIR · axial · 3.0mm · 0.53mm/px · z∈[-88,+69]mm · 4 of 55 slices shown]
[im 1/55]
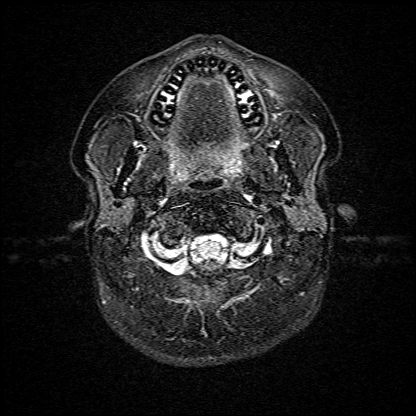
[im 19/55]
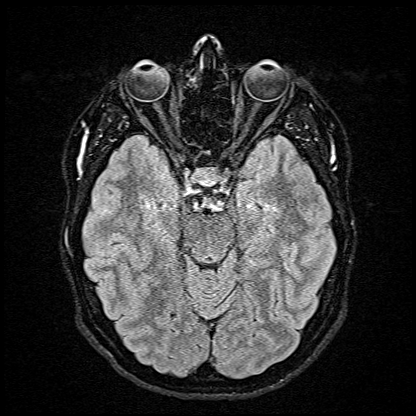
[im 37/55]
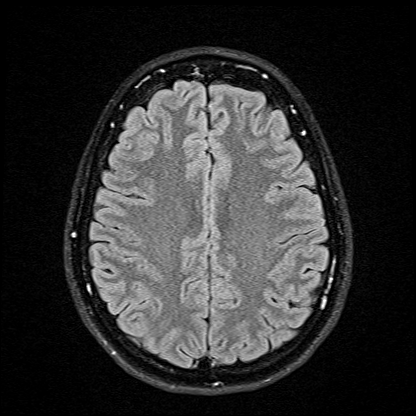
[im 55/55]
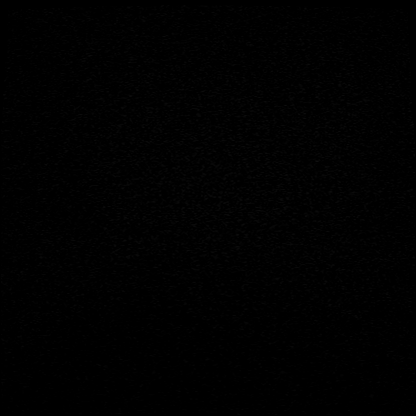

[Series 16: T1 · axial · 1.0mm · 0.98mm/px · z∈[-98,+72]mm · 11 of 175 slices shown (2 of 2)]
[im 1/175]
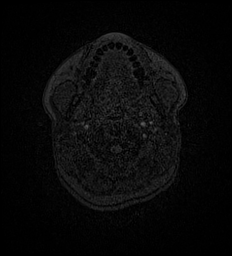
[im 14/175]
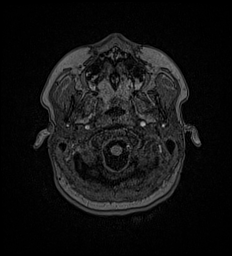
[im 27/175]
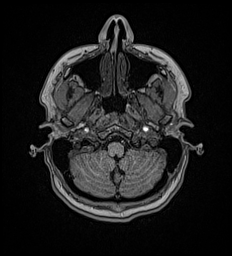
[im 41/175]
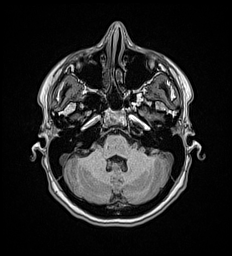
[im 54/175]
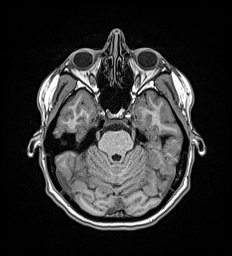
[im 67/175]
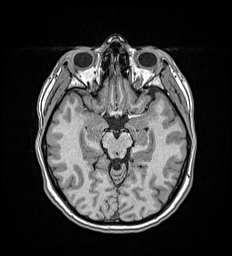
[im 81/175]
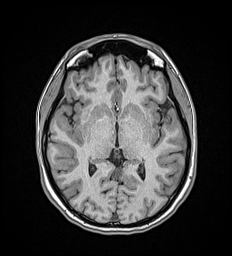
[im 94/175]
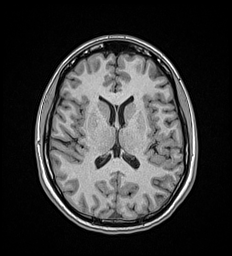
[im 121/175]
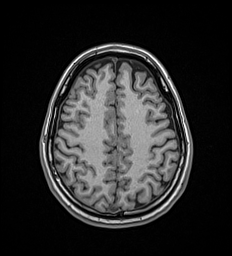
[im 148/175]
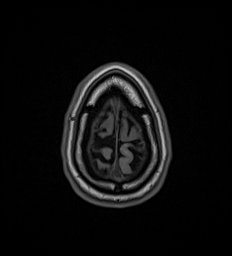
[im 175/175]
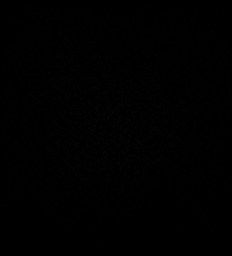

[Series 17: T2 · coronal · 5.0mm · 0.57mm/px · 2 of 29 slices shown (2 of 2)]
[im 1/29]
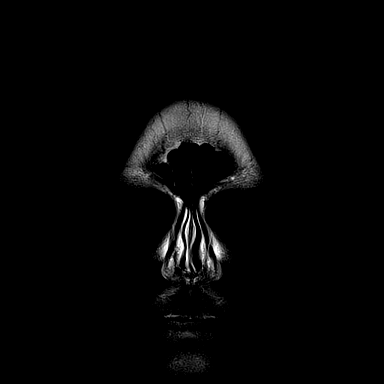
[im 29/29]
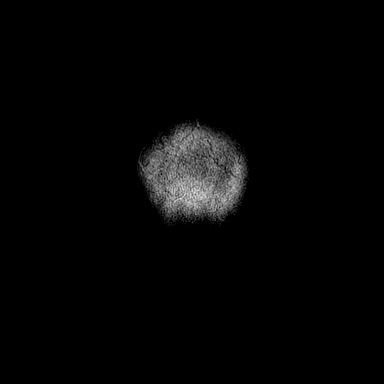

[45 of 48 positions shown; findings below may reference images not displayed]

FINDINGS: Brain: Cerebral volume within normal limits for patient age. Few
tiny subcentimeter foci of FLAIR hyperintensity noted at the
periventricular and subcortical left frontal white matter, felt to
be of doubtful significance (series 15, images 32, 33). No other
focal parenchymal signal abnormality.

No abnormal foci of restricted diffusion to suggest acute or
subacute ischemia. Gray-white matter differentiation well
maintained. No encephalomalacia to suggest chronic infarction. No
foci of susceptibility artifact to suggest acute or chronic
intracranial hemorrhage.

No mass lesion, midline shift or mass effect. No hydrocephalus. No
extra-axial fluid collection. Major dural sinuses are grossly
patent.

Pituitary gland and suprasellar region are normal. Midline
structures intact and normal.

Vascular: Major intracranial vascular flow voids well maintained and
normal in appearance.

Skull and upper cervical spine: Craniocervical junction normal.
Visualized upper cervical spine within normal limits. Bone marrow
signal intensity normal. No scalp soft tissue abnormality.

Sinuses/Orbits: Globes and orbital soft tissues within normal
limits.

Paranasal sinuses are clear. No mastoid effusion. Inner ear
structures normal.

Other: None.
IMPRESSION: Normal brain MRI. No acute intracranial abnormality or findings to
explain patient's symptoms identified.

## 2023-03-07 ENCOUNTER — Other Ambulatory Visit: Payer: Self-pay | Admitting: Family

## 2023-03-07 DIAGNOSIS — R1011 Right upper quadrant pain: Secondary | ICD-10-CM

## 2023-03-09 ENCOUNTER — Ambulatory Visit
Admission: RE | Admit: 2023-03-09 | Discharge: 2023-03-09 | Disposition: A | Discharge status: Home / Self Care | Payer: Managed Care, Other (non HMO) | Source: Ambulatory Visit | Attending: Family | Admitting: Family

## 2023-03-09 DIAGNOSIS — R1011 Right upper quadrant pain: Secondary | ICD-10-CM
